# Patient Record
Sex: Male | Born: 1970 | Race: White | Hispanic: No | Marital: Married | Smoking: Never smoker
Health system: Southern US, Community
[De-identification: ages and names within clinical notes are randomized; demographics above are authoritative.]

## PROBLEM LIST (undated history)

## (undated) DIAGNOSIS — J329 Chronic sinusitis, unspecified: Secondary | ICD-10-CM

## (undated) DIAGNOSIS — R1310 Dysphagia, unspecified: Secondary | ICD-10-CM

## (undated) DIAGNOSIS — G5760 Lesion of plantar nerve, unspecified lower limb: Secondary | ICD-10-CM

## (undated) DIAGNOSIS — J45909 Unspecified asthma, uncomplicated: Secondary | ICD-10-CM

## (undated) HISTORY — PX: ESOPHAGOGASTRODUODENOSCOPY (EGD) WITH ESOPHAGEAL DILATION: SHX5812

## (undated) HISTORY — DX: Unspecified asthma, uncomplicated: J45.909

## (undated) HISTORY — DX: Chronic sinusitis, unspecified: J32.9

## (undated) HISTORY — DX: Lesion of plantar nerve, unspecified lower limb: G57.60

## (undated) HISTORY — DX: Dysphagia, unspecified: R13.10

---

## 2000-06-16 ENCOUNTER — Emergency Department (HOSPITAL_COMMUNITY): Admission: EM | Admit: 2000-06-16 | Discharge: 2000-06-17 | Payer: Self-pay | Admitting: Emergency Medicine

## 2004-05-31 ENCOUNTER — Ambulatory Visit: Payer: Self-pay | Admitting: Family Medicine

## 2004-10-19 ENCOUNTER — Ambulatory Visit: Payer: Self-pay | Admitting: Internal Medicine

## 2005-04-22 ENCOUNTER — Ambulatory Visit: Payer: Self-pay | Admitting: Internal Medicine

## 2005-05-07 ENCOUNTER — Ambulatory Visit: Payer: Self-pay | Admitting: Internal Medicine

## 2005-12-11 ENCOUNTER — Ambulatory Visit: Payer: Self-pay | Admitting: Family Medicine

## 2006-02-15 ENCOUNTER — Ambulatory Visit: Payer: Self-pay | Admitting: Internal Medicine

## 2006-04-03 ENCOUNTER — Ambulatory Visit: Payer: Self-pay | Admitting: Family Medicine

## 2006-04-03 LAB — CONVERTED CEMR LAB
ALT: 55 units/L — ABNORMAL HIGH (ref 0–40)
AST: 49 units/L — ABNORMAL HIGH (ref 0–37)
Albumin: 4 g/dL (ref 3.5–5.2)
Alkaline Phosphatase: 76 units/L (ref 39–117)
BUN: 8 mg/dL (ref 6–23)
Basophils Absolute: 0.1 10*3/uL (ref 0.0–0.1)
Basophils Relative: 2.4 % — ABNORMAL HIGH (ref 0.0–1.0)
Calcium: 8.7 mg/dL (ref 8.4–10.5)
Glomerular Filtration Rate, Af Am: 125 mL/min/{1.73_m2}
HCT: 44.1 % (ref 39.0–52.0)
Hemoglobin: 15.3 g/dL (ref 13.0–17.0)
Lymphocytes Relative: 30.4 % (ref 12.0–46.0)
Neutrophils Relative %: 53.5 % (ref 43.0–77.0)
Potassium: 4.1 meq/L (ref 3.5–5.1)
RDW: 12.6 % (ref 11.5–14.6)
TSH: 2.12 microintl units/mL (ref 0.35–5.50)
Total Bilirubin: 0.7 mg/dL (ref 0.3–1.2)
Total Protein: 6.8 g/dL (ref 6.0–8.3)
WBC: 4 10*3/uL — ABNORMAL LOW (ref 4.5–10.5)

## 2006-04-08 ENCOUNTER — Ambulatory Visit: Payer: Self-pay | Admitting: Family Medicine

## 2006-04-08 LAB — CONVERTED CEMR LAB
ALT: 78 units/L — ABNORMAL HIGH (ref 0–40)
Albumin: 3.9 g/dL (ref 3.5–5.2)
Alkaline Phosphatase: 79 units/L (ref 39–117)
Total Bilirubin: 1 mg/dL (ref 0.3–1.2)
Total Protein: 6.9 g/dL (ref 6.0–8.3)

## 2006-04-21 ENCOUNTER — Ambulatory Visit: Payer: Self-pay | Admitting: Internal Medicine

## 2006-04-24 ENCOUNTER — Ambulatory Visit: Payer: Self-pay | Admitting: Family Medicine

## 2006-04-24 LAB — CONVERTED CEMR LAB
AST: 38 units/L — ABNORMAL HIGH (ref 0–37)
Albumin: 4.2 g/dL (ref 3.5–5.2)
Alkaline Phosphatase: 63 units/L (ref 39–117)
Total Bilirubin: 0.8 mg/dL (ref 0.3–1.2)
Total Protein: 6.7 g/dL (ref 6.0–8.3)

## 2006-04-30 ENCOUNTER — Ambulatory Visit: Payer: Self-pay | Admitting: Cardiology

## 2006-05-12 ENCOUNTER — Ambulatory Visit: Payer: Self-pay | Admitting: Family Medicine

## 2006-06-05 ENCOUNTER — Ambulatory Visit: Payer: Self-pay | Admitting: Internal Medicine

## 2006-07-02 ENCOUNTER — Ambulatory Visit: Payer: Self-pay | Admitting: Family Medicine

## 2006-07-02 LAB — CONVERTED CEMR LAB
Bilirubin, Direct: 0.1 mg/dL (ref 0.0–0.3)
CO2: 28 meq/L (ref 19–32)
Calcium: 9.1 mg/dL (ref 8.4–10.5)
GFR calc Af Amer: 143 mL/min
GFR calc non Af Amer: 118 mL/min
Glucose, Bld: 100 mg/dL — ABNORMAL HIGH (ref 70–99)
Potassium: 3.8 meq/L (ref 3.5–5.1)
Total Protein: 6.8 g/dL (ref 6.0–8.3)

## 2007-01-12 ENCOUNTER — Ambulatory Visit: Payer: Self-pay | Admitting: Internal Medicine

## 2007-05-26 ENCOUNTER — Ambulatory Visit: Payer: Self-pay | Admitting: Gastroenterology

## 2007-06-08 ENCOUNTER — Encounter: Payer: Self-pay | Admitting: Gastroenterology

## 2007-06-08 ENCOUNTER — Ambulatory Visit: Payer: Self-pay | Admitting: Gastroenterology

## 2007-06-08 ENCOUNTER — Encounter: Payer: Self-pay | Admitting: Internal Medicine

## 2008-02-20 ENCOUNTER — Ambulatory Visit: Payer: Self-pay | Admitting: *Deleted

## 2008-02-20 DIAGNOSIS — J40 Bronchitis, not specified as acute or chronic: Secondary | ICD-10-CM | POA: Insufficient documentation

## 2008-02-20 DIAGNOSIS — J019 Acute sinusitis, unspecified: Secondary | ICD-10-CM | POA: Insufficient documentation

## 2008-03-01 ENCOUNTER — Telehealth (INDEPENDENT_AMBULATORY_CARE_PROVIDER_SITE_OTHER): Payer: Self-pay | Admitting: *Deleted

## 2008-11-07 ENCOUNTER — Ambulatory Visit: Payer: Self-pay | Admitting: Family Medicine

## 2008-11-07 DIAGNOSIS — L255 Unspecified contact dermatitis due to plants, except food: Secondary | ICD-10-CM | POA: Insufficient documentation

## 2010-08-31 ENCOUNTER — Inpatient Hospital Stay (INDEPENDENT_AMBULATORY_CARE_PROVIDER_SITE_OTHER)
Admission: RE | Admit: 2010-08-31 | Discharge: 2010-08-31 | Disposition: A | Discharge status: Home / Self Care | Payer: 59 | Source: Ambulatory Visit | Attending: Emergency Medicine | Admitting: Emergency Medicine

## 2010-08-31 DIAGNOSIS — J019 Acute sinusitis, unspecified: Secondary | ICD-10-CM

## 2010-09-21 ENCOUNTER — Encounter: Payer: Self-pay | Admitting: Internal Medicine

## 2010-09-21 ENCOUNTER — Ambulatory Visit (INDEPENDENT_AMBULATORY_CARE_PROVIDER_SITE_OTHER): Payer: 59 | Admitting: Internal Medicine

## 2010-09-21 DIAGNOSIS — J40 Bronchitis, not specified as acute or chronic: Secondary | ICD-10-CM

## 2010-09-21 MED ORDER — ALBUTEROL SULFATE HFA 108 (90 BASE) MCG/ACT IN AERS: 2.0000 | INHALATION_SPRAY | Freq: Four times a day (QID) | RESPIRATORY_TRACT | Status: DC | PRN

## 2010-09-21 MED ORDER — AZITHROMYCIN 250 MG PO TABS: ORAL_TABLET | ORAL | Status: AC

## 2010-09-21 MED ORDER — PREDNISONE 10 MG PO TABS: ORAL_TABLET | ORAL | Status: AC

## 2010-09-21 NOTE — Progress Notes (Signed)
  Subjective:    Patient ID: Jose [name], male    DOB: 01-31-1971, 40 y.o.   MRN: [id number]  HPI  Symptoms started about 6 weeks ago, he had severe sinus pain and congestion. 2 weeks ago went to a local urgent care, was prescribed 10 days of amoxicillin according to the patient. It made the sinuses less congested but he still has nasal discharge, chest congestion and green sputum.  Past Medical History  Diagnosis Date  . Recurrent sinusitis   . Chronic recurrent bronchiolitis   . RAD (reactive airway disease)     ??   No past surgical history on file.    Review of Systems No fevers No nausea or vomiting No itchy eyes  or itchy nose.    Objective:   Physical Exam  Constitutional: He appears well-developed and well-nourished.  HENT:  Head: Normocephalic and atraumatic.  Right Ear: External ear normal.  Left Ear: External ear normal.  Mouth/Throat: No oropharyngeal exudate.       Sinuses not tender to palpation  Eyes: Right eye exhibits no discharge. Left eye exhibits no discharge.  Neck: Normal range of motion. Neck supple.  Cardiovascular: Normal rate, regular rhythm and normal heart sounds.   No murmur heard. Pulmonary/Chest:       Lungs with few rhonchi that clear with cough. Slightly increased expiratory time, no crackles or increased work of breathing          Assessment & Plan:

## 2010-09-21 NOTE — Assessment & Plan Note (Signed)
Symptoms going on for 6 weeks, not completely well after amoxicillin. He could have an  atypical infection. Plan: Steroids, Ventolin, a Z-Pak. See instructions

## 2010-09-21 NOTE — Patient Instructions (Signed)
meds as prescribed Call if no better soon or if still have problems in 2 weeks, you will need XRs etc.

## 2010-10-09 NOTE — Assessment & Plan Note (Signed)
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   Oniel has had minimal reflux symptoms over the years, but has used Tums  intermittently.  His main complaint is one of intermittent solid food  dysphagia, mostly in his upper retropharyngeal area.  He has actually  had meat get stuck where he had to have emesis to get rid of his  impaction.  He otherwise is in good health, but denies any chronic  medical problems.  He did apparently have a viral syndrome several years  ago and had abnormal liver function tests which apparently resolved  spontaneously.  This actually was in December of 2007 and I do have an  ultrasound and CT report at that time which were unremarkable except for  small liver cysts.  There also was increased liver echodensity noted on  the ultrasound, suggesting fatty infiltration of the liver.  This was  interpreted by Dr. Lina Sar.  Actually CT scan of the abdomen was  fairly normal.   [name] otherwise in good health and denies chronic medical problems.  He  is on no medications.  Denies drug allergies.   FAMILY HISTORY:  Noncontributory.   SOCIAL HISTORY:  He is married.  He lives with his wife and child.  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  He does not smoke or use ethanol.   REVIEW OF SYSTEMS:  Entirely noncontributory.   PHYSICAL EXAMINATION:  He is a healthy-appearing white male in no  distress, appearing his stated age.  He is 5 feet 10 inches and weighs  203 pounds.  Blood pressure is 102/68, and pulse was 80 and regular.  I  could not appreciate stigmata of chronic liver disease, and there was no  thyromegaly or  lymphadenopathy.  CHEST:  Clear and he was in a regular rhythm without murmurs, gallops,  or rubs.  I could not appreciate hepatosplenomegaly, abdominal masses,  or tenderness.  Bowel sounds were normal.  Both extremities were  unremarkable.  Mental status was clear.   ASSESSMENT:  1. Probable Schatzki's ring in the distal esophagus versus      gastroesophageal reflux disease and secondary peptic stricture      versus eosinophilic esophagitis.  The patient does give an atopic      history and is on Zyrtec.  2. Possible fatty infiltration of the liver with a history of previous      abnormal liver function tests.  He says these have not been      repeated in the last year.   RECOMMENDATIONS:  1. Outpatient endoscopy and esophageal dilatation if indicated.  The      risks and benefits of this procedure have been explained to the      patient in detail and he  agrees to proceed as planned.  2. The patient has had esophageal biopsies for eosinophils      esophagitis.  3. Consider repeat laboratory data and liver function testing if this      has not been done.  I will send this letter to Dr. Drue Novel for review,      since I do not have an old chart available at this time.     Vania Rea. Jarold Motto, MD, Caleen Essex, FAGA  Electronically Signed    DRP/MedQ  DD: 05/26/2007  DT: 05/26/2007  Job #: [id number]   cc:   Willow Ora, MD

## 2012-06-23 ENCOUNTER — Encounter: Payer: Self-pay | Admitting: Internal Medicine

## 2012-06-23 ENCOUNTER — Ambulatory Visit (INDEPENDENT_AMBULATORY_CARE_PROVIDER_SITE_OTHER): Payer: 59 | Admitting: Internal Medicine

## 2012-06-23 VITALS — BP 132/84 | HR 84 | Temp 98.2°F | Wt 211.0 lb

## 2012-06-23 DIAGNOSIS — R42 Dizziness and giddiness: Secondary | ICD-10-CM

## 2012-06-23 LAB — CBC WITH DIFFERENTIAL/PLATELET
Basophils Absolute: 0 10*3/uL (ref 0.0–0.1)
Basophils Relative: 0.6 % (ref 0.0–3.0)
Hemoglobin: 14.9 g/dL (ref 13.0–17.0)
Lymphocytes Relative: 22.6 % (ref 12.0–46.0)
Monocytes Relative: 9.3 % (ref 3.0–12.0)
Neutro Abs: 4.6 10*3/uL (ref 1.4–7.7)
RBC: 5.19 Mil/uL (ref 4.22–5.81)

## 2012-06-23 LAB — BASIC METABOLIC PANEL
Calcium: 9.3 mg/dL (ref 8.4–10.5)
GFR: 133.12 mL/min (ref >60.00)
Sodium: 137 mEq/L (ref 135–145)

## 2012-06-23 NOTE — Patient Instructions (Addendum)
Call if not improving in a week, call anytime if the symptoms are severe  Dizziness Dizziness is a common problem. It is a feeling of unsteadiness or lightheadedness. You may feel like you are about to faint. Dizziness can lead to injury if you stumble or fall. A person of any age group can suffer from dizziness, but dizziness is more common in older adults. CAUSES  Dizziness can be caused by many different things, including:  Middle ear problems.  Standing for too long.  Infections.  An allergic reaction.  Aging.  An emotional response to something, such as the sight of blood.  Side effects of medicines.  Fatigue.  Problems with circulation or blood pressure.  Excess use of alcohol, medicines, or illegal drug use.  Breathing too fast (hyperventilation).  An arrhythmia or problems with your heart rhythm.  Low red blood cell count (anemia).  Pregnancy.  Vomiting, diarrhea, fever, or other illnesses that cause dehydration.  Diseases or conditions such as Parkinson's disease, high blood pressure (hypertension), diabetes, and thyroid problems.  Exposure to extreme heat. DIAGNOSIS  To find the cause of your dizziness, your caregiver may do a physical exam, lab tests, radiologic imaging scans, or an electrocardiography test (ECG).  TREATMENT  Treatment of dizziness depends on the cause of your symptoms and can vary greatly. HOME CARE INSTRUCTIONS   Drink enough fluids to keep your urine clear or pale yellow. This is especially important in very hot weather. In the elderly, it is also important in cold weather.  If your dizziness is caused by medicines, take them exactly as directed. When taking blood pressure medicines, it is especially important to get up slowly.  Rise slowly from chairs and steady yourself until you feel okay.  In the morning, first sit up on the side of the bed. When this seems okay, stand slowly while holding onto something until you know your  balance is fine.  If you need to stand in one place for a long time, be sure to move your legs often. Tighten and relax the muscles in your legs while standing.  If dizziness continues to be a problem, have someone stay with you for a day or two. Do this until you feel you are well enough to stay alone. Have the person call your caregiver if he or she notices changes in you that are concerning.  Do not drive or use heavy machinery if you feel dizzy.  Do not drink alcohol. SEEK IMMEDIATE MEDICAL CARE IF:   Your dizziness or lightheadedness gets worse.  You feel nauseous or vomit.  You develop problems with talking, walking, weakness, or using your arms, hands, or legs.  You are not thinking clearly or you have difficulty forming sentences. It may take a friend or family member to determine if your thinking is normal.  You develop chest pain, abdominal pain, shortness of breath, or sweating.  Your vision changes.  You notice any bleeding.  You have side effects from medicine that seems to be getting worse rather than better. MAKE SURE YOU:   Understand these instructions.  Will watch your condition.  Will get help right away if you are not doing well or get worse. Document Released: 11/06/2000 Document Revised: 08/05/2011 Document Reviewed: 11/30/2010 [facility] Patient Information 2013 [place], [place].

## 2012-06-23 NOTE — Assessment & Plan Note (Signed)
Likely benign peripheral dizziness, mild labyrinthitis?. Plan:  Rest, drink plenty of fluids. Will screen for anemia and electrolyte imbalances with labs. See instructions.

## 2012-06-23 NOTE — Progress Notes (Signed)
  Subjective:    Patient ID: Jose [name], male    DOB: 08-26-1970, 42 y.o.   MRN: [id number]  HPI Acute visit 2 days history of dizziness described as lightheadedness whenever he stands up quickly or moves his head right-to-left. Symptoms last few seconds,  better once he rests/stops moving his head.  Past Medical History  Diagnosis Date  . Recurrent sinusitis   . Chronic recurrent bronchiolitis   . RAD (reactive airway disease)     ??   Past Surgical History  Procedure Date  . No past surgeries     Review of Systems No recent URI symptoms. Recent headaches or head injury. No nausea, vomiting, diarrhea. No visual disturbances. not taking any medications.    Objective:   Physical Exam  General -- alert, well-developed, and well-nourished.   Neck -- normal carotid pulse HEENT-- not pale  Lungs -- normal respiratory effort, no intercostal retractions, no accessory muscle use, and normal breath sounds.   Heart-- normal rate, regular rhythm, no murmur, and no gallop.     Extremities-- no pretibial edema bilaterally Neurologic-- alert & oriented X3; speech fluent, gait normal, motor symmetric, DTRs symmetric. EOMI. Romberg absent. Psych-- Cognition and judgment appear intact. Alert and cooperative with normal attention span and concentration.  not anxious appearing and not depressed appearing.       Assessment & Plan:

## 2012-06-24 ENCOUNTER — Encounter: Payer: Self-pay | Admitting: Internal Medicine

## 2012-06-24 ENCOUNTER — Encounter: Payer: Self-pay | Admitting: *Deleted

## 2012-10-12 ENCOUNTER — Ambulatory Visit (INDEPENDENT_AMBULATORY_CARE_PROVIDER_SITE_OTHER): Payer: 59 | Admitting: Internal Medicine

## 2012-10-12 ENCOUNTER — Encounter: Payer: Self-pay | Admitting: Internal Medicine

## 2012-10-12 VITALS — BP 118/60 | HR 65 | Temp 98.4°F | Ht 71.0 in | Wt 207.0 lb

## 2012-10-12 DIAGNOSIS — Z23 Encounter for immunization: Secondary | ICD-10-CM

## 2012-10-12 DIAGNOSIS — Z0000 Encounter for general adult medical examination without abnormal findings: Secondary | ICD-10-CM

## 2012-10-12 DIAGNOSIS — R1310 Dysphagia, unspecified: Secondary | ICD-10-CM

## 2012-10-12 DIAGNOSIS — J45909 Unspecified asthma, uncomplicated: Secondary | ICD-10-CM | POA: Insufficient documentation

## 2012-10-12 NOTE — Patient Instructions (Addendum)
Come back fasting: CMP, TSH, FLP--- dx V70 Next visit one year and as needed Be sure you have a flu shot this season.

## 2012-10-12 NOTE — Progress Notes (Signed)
  Subjective:    Patient ID: Jose [name], male    DOB: [birth year], 42 y.o.   MRN: [id number]  HPI CPX Would like to see a dermatologist. See assessment and plan  Past Medical History  Diagnosis Date  . Recurrent sinusitis   . Dysphagia     EGD : occult stricture , Bx slt increaed eosinophiles  . RAD (reactive airway disease)     ??   Past Surgical History  Procedure Laterality Date  . No past surgeries     History   Social History  . Marital Status: Married    Spouse Name: N/A    Number of Children: 2  . Years of Education: N/A   Occupational History  . police officer [employer]   Social History Main Topics  . Smoking status: Never Smoker   . Smokeless tobacco: Never Used  . Alcohol Use: No  . Drug Use: No  . Sexually Active: Not on file   Other Topics Concern  . Not on file   Social History Narrative  . No narrative on file   Family History  Problem Relation Age of Onset  . Colon cancer Neg Hx   . Prostate cancer Neg Hx   . CAD Neg Hx   . Diabetes Neg Hx     Review of Systems Diet is healthy most of the time, he is active at work and also exercises. No chest pain or shortness of breath No  nausea, vomiting, diarrhea blood in the stools. No dysuria, gross hematuria. No anxiety or depression. Occasional dysphagia, symptoms are not severe. See assessment and plan. Not taking any medication.    Objective:   Physical Exam General -- alert, well-developed,NAD Neck --no thyromegaly , normal carotid pulse Lungs -- normal respiratory effort, no intercostal retractions, no accessory muscle use, and normal breath sounds.   Heart-- normal rate, regular rhythm, no murmur, and no gallop.   Abdomen--soft, non-tender, no distention, no masses, no HSM, no guarding, and no rigidity.   Extremities-- no pretibial edema bilaterally Skin: Fair skinned, few lesions consistent with SK Neurologic-- alert & oriented X3 and strength normal in all  extremities. Psych-- Cognition and judgment appear intact. Alert and cooperative with normal attention span and concentration.  not anxious appearing and not depressed appearing.       Assessment & Plan:

## 2012-10-12 NOTE — Assessment & Plan Note (Addendum)
Very se dom has dysphagia, had a EGD 05-2007: occult stricture? , Bx slt increased eosinophiles Plan: Observation

## 2012-10-12 NOTE — Assessment & Plan Note (Signed)
Tdap today Diet exercise discussed Labs: CMP, TSH, FLP Would like to see a dermatologist, has a # of skin lesions (likely SK)---> Sunscreen discussed, refer to dermatology. Office visit in one year.

## 2012-10-13 ENCOUNTER — Telehealth: Payer: Self-pay | Admitting: *Deleted

## 2012-10-13 ENCOUNTER — Other Ambulatory Visit (INDEPENDENT_AMBULATORY_CARE_PROVIDER_SITE_OTHER): Payer: 59

## 2012-10-13 DIAGNOSIS — Z0000 Encounter for general adult medical examination without abnormal findings: Secondary | ICD-10-CM

## 2012-10-13 LAB — COMPREHENSIVE METABOLIC PANEL
ALT: 25 U/L (ref 0–53)
AST: 23 U/L (ref 0–37)
Albumin: 4 g/dL (ref 3.5–5.2)
Alkaline Phosphatase: 73 U/L (ref 39–117)
Potassium: 4.1 mEq/L (ref 3.5–5.1)
Sodium: 136 mEq/L (ref 135–145)
Total Bilirubin: 0.6 mg/dL (ref 0.3–1.2)
Total Protein: 7 g/dL (ref 6.0–8.3)

## 2012-10-13 LAB — LIPID PANEL
HDL: 32 mg/dL — ABNORMAL LOW (ref >39.00)
LDL Cholesterol: 87 mg/dL (ref 0–99)
Total CHOL/HDL Ratio: 5
VLDL: 37 mg/dL (ref 0.0–40.0)

## 2012-10-13 NOTE — Telephone Encounter (Signed)
Message copied by Florene Glen on Tue Oct 13, 2012  8:28 AM ------      Message from: Jarold Motto, DAVID R      Created: Tue Oct 13, 2012  8:19 AM      Regarding: RE: question       We will make him an off appointment      ----- Message -----         From: Wanda Plump, MD         Sent: 10/12/2012   6:14 PM           To: Mardella Layman, MD      Subject: question                                                 Onalee Hua, you did a EGD on him in 2009, Bx showed some eosinophiles, still has mild dysphagia from time to time. Do you need to see him again?      THx , Jose        ------

## 2012-10-13 NOTE — Telephone Encounter (Signed)
Spoke with pt and offered him an earlier appt with a midlevel, but he will wait and see Dr Jarold Motto on 10/29/12. Mailed NP letter, appt and directions to pt.

## 2012-10-15 ENCOUNTER — Encounter: Payer: Self-pay | Admitting: *Deleted

## 2012-10-29 ENCOUNTER — Ambulatory Visit (INDEPENDENT_AMBULATORY_CARE_PROVIDER_SITE_OTHER): Payer: 59 | Admitting: Gastroenterology

## 2012-10-29 ENCOUNTER — Encounter: Payer: Self-pay | Admitting: Gastroenterology

## 2012-10-29 VITALS — BP 110/80 | HR 84 | Ht 71.0 in | Wt 207.0 lb

## 2012-10-29 DIAGNOSIS — K219 Gastro-esophageal reflux disease without esophagitis: Secondary | ICD-10-CM

## 2012-10-29 DIAGNOSIS — R1310 Dysphagia, unspecified: Secondary | ICD-10-CM

## 2012-10-29 MED ORDER — DEXLANSOPRAZOLE 60 MG PO CPDR: 60.0000 mg | DELAYED_RELEASE_CAPSULE | Freq: Every day | ORAL | Status: DC

## 2012-10-29 NOTE — Progress Notes (Signed)
History of Present Illness:  This is a 42 year old Caucasian male who underwent endoscopy in January 2009 because of dysphagia.  Endoscopic exam was reviewed and was fairly unremarkable with empiric dilation performed.  Esophageal biopsy showed inflammatory cells but not enough eosinophils to call his process eosinophilic esophagitis.  He does have some mild atopy, but denies food allergies.  He now has recurrent solid food dysphagia over the last 2 years with minimal reflux symptoms.  He denies Raynaud's phenomena or other symptoms of collagen vascular disease, any weight loss, lower GI or hepatobiliary complaints.  He has not had to visit emergency room or have her make maneuvers performed.  His dysphagia seems to be more in his upper retrosternal area.  I have reviewed this patient's present history, medical and surgical past history, allergies and medications.     ROS:   All systems were reviewed and are negative unless otherwise stated in the HPI.    Physical Exam: Blood pressure 110/80, pulse 84 and regular, and weight 207 pounds the BMI of 28.8.  Oral pharyngeal exam and neck exam is unremarkable. General well developed well nourished patient in no acute distress, appearing their stated age Eyes PERRLA, no icterus, fundoscopic exam per opthamologist Skin no lesions noted Neck supple, no adenopathy, no thyroid enlargement, no tenderness Chest clear to percussion and auscultation Heart no significant murmurs, gallops or rubs noted Abdomen no hepatosplenomegaly masses or tenderness, BS normal.  Extremities no acute joint lesions, edema, phlebitis or evidence of cellulitis. Neurologic patient oriented x 3, cranial nerves intact, no focal neurologic deficits noted. Psychological mental status normal and normal affect.  Assessment and plan: Recurrent dysphagia consistent with esophageal obstruction either from a Schatzki's rang, chronic GERD with peptic stricture, or eosinophilic esophagitis.   We will repeat his endoscopy with esophageal biopsies and appeared dilations.  I placed him on daily Nexium 40 mg 30 minutes before the first meal of the day.  He probably will need a high-resolution esophageal manometry exam also.  No diagnosis found.

## 2012-10-29 NOTE — Patient Instructions (Addendum)
You have been scheduled for an endoscopy with propofol. Please follow written instructions given to you at your visit today. If you use inhalers (even only as needed), please bring them with you on the day of your procedure. Your physician has requested that you go to www.startemmi.com and enter the access code given to you at your visit today. This web site gives a general overview about your procedure. However, you should still follow specific instructions given to you by our office regarding your preparation for the procedure.   We have given you samples of the following medication to take: Dexilant 60 mg, please take one capsule by mouth thirty minutes before breakfast once daily. If this works well for you please call us back for a prescription.   ___________________________________________________________________________________________  Diet for Gastroesophageal Reflux Disease, Adult Reflux (acid reflux) is when acid from your stomach flows up into the esophagus. When acid comes in contact with the esophagus, the acid causes irritation and soreness (inflammation) in the esophagus. When reflux happens often or so severely that it causes damage to the esophagus, it is called gastroesophageal reflux disease (GERD). Nutrition therapy can help ease the discomfort of GERD. FOODS OR DRINKS TO AVOID OR LIMIT  Smoking or chewing tobacco. Nicotine is one of the most potent stimulants to acid production in the gastrointestinal tract.  Caffeinated and decaffeinated coffee and black tea.  Regular or low-calorie carbonated beverages or energy drinks (caffeine-free carbonated beverages are allowed).   Strong spices, such as black pepper, white pepper, red pepper, cayenne, curry powder, and chili powder.  Peppermint or spearmint.  Chocolate.  High-fat foods, including meats and fried foods. Extra added fats including oils, butter, salad dressings, and nuts. Limit these to less than 8 tsp per  day.  Fruits and vegetables if they are not tolerated, such as citrus fruits or tomatoes.  Alcohol.  Any food that seems to aggravate your condition. If you have questions regarding your diet, call your caregiver or a registered dietitian. OTHER THINGS THAT MAY HELP GERD INCLUDE:   Eating your meals slowly, in a relaxed setting.  Eating 5 to 6 small meals per day instead of 3 large meals.  Eliminating food for a period of time if it causes distress.  Not lying down until 3 hours after eating a meal.  Keeping the head of your bed raised 6 to 9 inches (15 to 23 cm) by using a foam wedge or blocks under the legs of the bed. Lying flat may make symptoms worse.  Being physically active. Weight loss may be helpful in reducing reflux in overweight or obese adults.  Wear loose fitting clothing EXAMPLE MEAL PLAN This meal plan is approximately 2,000 calories based on https://www.[facility].org/ meal planning guidelines. Breakfast   cup cooked oatmeal.  1 cup strawberries.  1 cup low-fat milk.  1 oz almonds. Snack  1 cup cucumber slices.  6 oz yogurt (made from low-fat or fat-free milk). Lunch  2 slice whole-wheat bread.  2 oz sliced [place].  2 tsp mayonnaise.  1 cup blueberries.  1 cup snap peas. Snack  6 whole-wheat crackers.  1 oz string cheese. Dinner   cup brown rice.  1 cup mixed veggies.  1 tsp olive oil.  3 oz grilled fish. Document Released: 05/13/2005 Document Revised: 08/05/2011 Document Reviewed: 03/29/2011 [facility] Patient Information 2014 [place], [place]. ________________________________________________________________________________________________________________  We are excited to introduce MyChart, a new best-in-class service that provides you online access to important information in your electronic medical record. We want to make it easier for you to view your health information - all in one secure  location - when and where you need it. We expect MyChart will enhance the quality of care and service we provide.  When you register for MyChart, you can:    View your test results.    Request appointments and receive appointment reminders via email.    Request medication renewals.    View your medical history, allergies, medications and immunizations.    Communicate with your physician's office through a password-protected site.    Conveniently print information such as your medication lists.  To find out if MyChart is right for you, please talk to a member of our clinical staff today. We will gladly answer your questions about this free health and wellness tool.  If you are age 40 or older and want a member of your family to have access to your record, you must provide written consent by completing a proxy form available at our office. Please speak to our clinical staff about guidelines regarding accounts for patients younger than age 40.  As you activate your MyChart account and need any technical assistance, please call the MyChart technical support line at [phone]) or email your question to [email or web address]. If you email your question(s), please include your name, a return phone number and the best time to reach you.  If you have non-urgent health-related questions, you can send a message to our office through MyChart at [email or web address]. If you have a medical emergency, call 911.  Thank you for using MyChart as your new health and wellness resource!   MyChart licensed from [employer],  4540-9811. Patents Pending.

## 2012-11-09 ENCOUNTER — Ambulatory Visit (AMBULATORY_SURGERY_CENTER): Payer: 59 | Admitting: Gastroenterology

## 2012-11-09 ENCOUNTER — Encounter: Payer: Self-pay | Admitting: Gastroenterology

## 2012-11-09 VITALS — BP 122/69 | HR 77 | Temp 96.9°F | Resp 21 | Ht 71.0 in | Wt 207.0 lb

## 2012-11-09 DIAGNOSIS — K209 Esophagitis, unspecified without bleeding: Secondary | ICD-10-CM

## 2012-11-09 DIAGNOSIS — K219 Gastro-esophageal reflux disease without esophagitis: Secondary | ICD-10-CM

## 2012-11-09 DIAGNOSIS — R1310 Dysphagia, unspecified: Secondary | ICD-10-CM

## 2012-11-09 MED ORDER — SODIUM CHLORIDE 0.9 % IV SOLN: 500.0000 mL | INTRAVENOUS | Status: DC

## 2012-11-09 NOTE — Progress Notes (Signed)
Called to room to assist during endoscopic procedure.  Patient ID and intended procedure confirmed with present staff. Received instructions for my participation in the procedure from the performing physician.  

## 2012-11-09 NOTE — Patient Instructions (Addendum)
YOU HAD AN ENDOSCOPIC PROCEDURE TODAY AT THE [facility]: Refer to the procedure report that was given to you for any specific questions about what was found during the examination.  If the procedure report does not answer your questions, please call your gastroenterologist to clarify.  If you requested that your care partner not be given the details of your procedure findings, then the procedure report has been included in a sealed envelope for you to review at your convenience later.  YOU SHOULD EXPECT: Some feelings of bloating in the abdomen. Passage of more gas than usual.  Walking can help get rid of the air that was put into your GI tract during the procedure and reduce the bloating. If you had a lower endoscopy (such as a colonoscopy or flexible sigmoidoscopy) you may notice spotting of blood in your stool or on the toilet paper. If you underwent a bowel prep for your procedure, then you may not have a normal bowel movement for a few days.  DIET:  YOU MAY HAVE CLEAR LIQUIDS TO DRINK AT 5PM.   IF THAT GOES WELL, YOU MAY HAVE A SOFT DIET FOR THE REST OF THE DAY.  tOMORROW YOU MAY RESUME A REGULAR DIET.  ACTIVITY: Your care partner should take you home directly after the procedure.  You should plan to take it easy, moving slowly for the rest of the day.  You can resume normal activity the day after the procedure however you should NOT DRIVE or use heavy machinery for 24 hours (because of the sedation medicines used during the test).    SYMPTOMS TO REPORT IMMEDIATELY: A gastroenterologist can be reached at any hour.  During normal business hours, 8:30 AM to 5:00 PM Monday through Friday, call [phone].  After hours and on weekends, please call the GI answering service at [phone] who will take a message and have the physician on call contact you.   Following upper endoscopy (EGD)  Vomiting of blood or coffee ground material  New chest pain or pain under the shoulder  blades  Painful or persistently difficult swallowing  New shortness of breath  Fever of 100F or higher  Black, tarry-looking stools  FOLLOW UP: If any biopsies were taken you will be contacted by phone or by letter within the next 1-3 weeks.  Call your gastroenterologist if you have not heard about the biopsies in 3 weeks.  Our staff will call the home number listed on your records the next business day following your procedure to check on you and address any questions or concerns that you may have at that time regarding the information given to you following your procedure. This is a courtesy call and so if there is no answer at the home number and we have not heard from you through the emergency physician on call, we will assume that you have returned to your regular daily activities without incident.  SIGNATURES/CONFIDENTIALITY: You and/or your care partner have signed paperwork which will be entered into your electronic medical record.  These signatures attest to the fact that that the information above on your After Visit Summary has been reviewed and is understood.  Full responsibility of the confidentiality of this discharge information lies with you and/or your care-partner.   YOU MAY NEED A MANOMETRY TEST IF THIS DOESN'T WORK.  DR. Jarold Motto TOOK BIOPSIES OF YOUR ESOPHAGUS TO DETERMINE IF YOU HAVE ESOPHAGITIS.

## 2012-11-09 NOTE — Op Note (Signed)
 [facility] [address] [place], [address]   ENDOSCOPY PROCEDURE REPORT  PATIENT: Jose, [name]  MR#: [id number] BIRTHDATE: Jul 31, 1970 , 41  yrs. old GENDER: Male ENDOSCOPIST:Zymarion Favorite Hale Bogus, MD, [facility] REFERRED BY: PROCEDURE DATE:  11/09/2012 PROCEDURE:   EGD w/ biopsy and Maloney dilation of esophagus ASA CLASS:    Class II INDICATIONS: Dysphagia. MEDICATION: propofol (Diprivan) 250mg  IV TOPICAL ANESTHETIC:   Cetacaine Spray  DESCRIPTION OF PROCEDURE:   After the risks and benefits of the procedure were explained, informed consent was obtained.  The LB [id number]  endoscope was introduced through the mouth  and advanced to the second portion of the duodenum .  The instrument was slowly withdrawn as the mucosa was fully examined.    The upper, middle and distal third of the esophagus were carefully inspected and no abnormalities were noted.  The z-line was well seen at the GEJ.  The endoscope was pushed into the fundus which was normal including a retroflexed view.  The antrum, gastric body, first and second part of the duodenum were unremarkable. biopsies were obtained of the esophagus for pathologic review.  Patient was dilated.  with with a #58 [place] dilator.  There is no resistance to slight heme on the dilator.  There is no evidence of retained food products in the upper gut   Retroflexed views revealed no abnormalities.    The scope was then withdrawn from the patient and the procedure completed.  COMPLICATIONS: There were no complications.   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  RECOMMENDATIONS: we will see how this patient does symptomatically.  He continues with dysphagia we will do high-resolution esophageal manometry.  In the interim we'll treat him for acid reflux problems.  She had eosinophilic esophagitis of course of management would  be different.    _______________________________ eSigned:  Mardella Layman, MD, [facility] 11/09/2012 3:43 PM cc  DR.Paz  standard discharge   PATIENT NAME:  Jose, [name] MR#: [id number]

## 2012-11-09 NOTE — Progress Notes (Addendum)
Patient did not have preoperative order for IV antibiotic SSI prophylaxis. (G8918)  Patient did not experience any of the following events: a burn prior to discharge; a fall within the facility; wrong site/side/patient/procedure/implant event; or a hospital transfer or hospital admission upon discharge from the facility. (G8907)  

## 2012-11-10 ENCOUNTER — Telehealth: Payer: Self-pay | Admitting: Gastroenterology

## 2012-11-10 ENCOUNTER — Telehealth: Payer: Self-pay | Admitting: *Deleted

## 2012-11-10 MED ORDER — DEXLANSOPRAZOLE 60 MG PO CPDR: 60.0000 mg | DELAYED_RELEASE_CAPSULE | Freq: Every day | ORAL | Status: DC

## 2012-11-10 NOTE — Telephone Encounter (Signed)
Name identifier, left message, follow-up 

## 2012-11-10 NOTE — Telephone Encounter (Signed)
I called patient back because patient had a side effect to the Nexium, was given Dexilant. I advised patient that I can send Dexilant and patient said that's what he wanted not Nexium. Dexilant sent to pharmacy.

## 2012-11-16 ENCOUNTER — Encounter: Payer: Self-pay | Admitting: Gastroenterology

## 2013-04-21 ENCOUNTER — Ambulatory Visit (INDEPENDENT_AMBULATORY_CARE_PROVIDER_SITE_OTHER): Payer: 59 | Admitting: Internal Medicine

## 2013-04-21 ENCOUNTER — Encounter: Payer: Self-pay | Admitting: Internal Medicine

## 2013-04-21 VITALS — BP 113/75 | HR 95 | Temp 97.8°F | Wt 199.0 lb

## 2013-04-21 DIAGNOSIS — J069 Acute upper respiratory infection, unspecified: Secondary | ICD-10-CM

## 2013-04-21 MED ORDER — AMOXICILLIN 500 MG PO CAPS: 1000.0000 mg | ORAL_CAPSULE | Freq: Two times a day (BID) | ORAL | Status: DC

## 2013-04-21 NOTE — Patient Instructions (Signed)
Rest, fluids , tylenol For cough, take Mucinex DM twice a day as needed  For congestion use pseudoephedrine  Also OTC Nasocort: 2 nasal sprays on each side of the nose daily  until you feel better Take the antibiotic as prescribed  (Amoxicillin) if no better in 2-3  days  Call if no better in few days Call anytime if the symptoms are severe

## 2013-04-21 NOTE — Progress Notes (Signed)
Pre visit review using our clinic review tool, if applicable. No additional management support is needed unless otherwise documented below in the visit note. 

## 2013-04-21 NOTE — Progress Notes (Signed)
   Subjective:    Patient ID: Jose [name], male    DOB: 01/17/1971, 42 y.o.   MRN: [id number]  HPI Acute visit  Symptoms started 4 days ago with sore throat, cough mostly in the morning with mild greenish sputum production, throat congestion. He is taking Mucinex DM and pseudoephedrine.  Past Medical History  Diagnosis Date  . Recurrent sinusitis   . Dysphagia     EGD : occult stricture , Bx slt increaed eosinophiles  . RAD (reactive airway disease)     ??   Past Surgical History  Procedure Laterality Date  . No past surgeries       Review of Systems Denies fever or chills Denies chest pain, shortness or breath. He has a history of reactive airway disease but he is not wheezing currently. No myalgias, no nausea or vomiting. His wife and son are sick and taken antibiotics for respiratory infections.     Objective:   Physical Exam  BP 113/75  Pulse 95  Temp(Src) 97.8 F (36.6 C)  Wt 199 lb (90.266 kg)  SpO2 95% General -- alert, well-developed, NAD.  HEENT-- Not pale. TMs normal, throat symmetric, no redness or discharge. Face symmetric, sinuses not tender to palpation. Nose  congested. Lungs -- normal respiratory effort, no intercostal retractions, no accessory muscle use, and normal breath sounds.  Heart-- normal rate, regular rhythm, no murmur.  Neurologic--  alert & oriented X3. Speech normal, gait normal, strength normal in all extremities.  Psych-- Cognition and judgment appear intact. Cooperative with normal attention span and concentration. No anxious appearing , no depressed appearing.     Assessment & Plan:  URI, Symptoms consistent with URI, conservative treatment for few days but if he is not better he will start an antibiotic. I asked the patient to be sure that Mucinex D and does not contain pseudoephedrine (see instructions )

## 2013-07-29 ENCOUNTER — Ambulatory Visit (HOSPITAL_BASED_OUTPATIENT_CLINIC_OR_DEPARTMENT_OTHER)
Admission: RE | Admit: 2013-07-29 | Discharge: 2013-07-29 | Disposition: A | Discharge status: Home / Self Care | Payer: 59 | Source: Ambulatory Visit | Attending: Nurse Practitioner | Admitting: Nurse Practitioner

## 2013-07-29 ENCOUNTER — Encounter: Payer: Self-pay | Admitting: Nurse Practitioner

## 2013-07-29 ENCOUNTER — Other Ambulatory Visit: Payer: Self-pay | Admitting: Nurse Practitioner

## 2013-07-29 ENCOUNTER — Ambulatory Visit (INDEPENDENT_AMBULATORY_CARE_PROVIDER_SITE_OTHER): Payer: 59 | Admitting: Nurse Practitioner

## 2013-07-29 VITALS — BP 128/75 | HR 76 | Temp 98.2°F | Ht 71.0 in | Wt 197.4 lb

## 2013-07-29 DIAGNOSIS — M545 Low back pain, unspecified: Secondary | ICD-10-CM | POA: Insufficient documentation

## 2013-07-29 DIAGNOSIS — R209 Unspecified disturbances of skin sensation: Secondary | ICD-10-CM

## 2013-07-29 DIAGNOSIS — M5489 Other dorsalgia: Secondary | ICD-10-CM

## 2013-07-29 DIAGNOSIS — R202 Paresthesia of skin: Secondary | ICD-10-CM

## 2013-07-29 DIAGNOSIS — M549 Dorsalgia, unspecified: Secondary | ICD-10-CM

## 2013-07-29 LAB — VITAMIN B12: VITAMIN B 12: 254 pg/mL (ref 211–911)

## 2013-07-29 MED ORDER — PREDNISONE 10 MG PO TABS: ORAL_TABLET | ORAL | Status: DC
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
## 2013-07-29 NOTE — Patient Instructions (Signed)
Numbness in feet may be related to nerve inflammation in back which can be caused by inflammatory arthritis. Please start prednisone & do back stretches daily. Avoid movement that cause pain. Follow up in 3 weeks to evaluate treatment & review xray & lab results. Our office will call if there are urgent results.  Back Exercises Back exercises help treat and prevent back injuries. The goal of back exercises is to increase the strength of your abdominal and back muscles and the flexibility of your back. These exercises should be started when you no longer have back pain. Back exercises include:  Pelvic Tilt. Lie on your back with your knees bent. Tilt your pelvis until the lower part of your back is against the floor. Hold this position 5 to 10 sec and repeat 5 to 10 times.  Knee to Chest. Pull first 1 knee up against your chest and hold for 20 to 30 seconds, repeat this with the other knee, and then both knees. This may be done with the other leg straight or bent, whichever feels better.  Sit-Ups or Curl-Ups. Bend your knees 90 degrees. Start with tilting your pelvis, and do a partial, slow sit-up, lifting your trunk only 30 to 45 degrees off the floor. Take at least 2 to 3 seconds for each sit-up. Do not do sit-ups with your knees out straight. If partial sit-ups are difficult, simply do the above but with only tightening your abdominal muscles and holding it as directed.  Hip-Lift. Lie on your back with your knees flexed 90 degrees. Push down with your feet and shoulders as you raise your hips a couple inches off the floor; hold for 10 seconds, repeat 5 to 10 times.  Back arches. Lie on your stomach, propping yourself up on bent elbows. Slowly press on your hands, causing an arch in your low back. Repeat 3 to 5 times. Any initial stiffness and discomfort should lessen with repetition over time.  Shoulder-Lifts. Lie face down with arms beside your body. Keep hips and torso pressed to floor as you  slowly lift your head and shoulders off the floor. Do not overdo your exercises, especially in the beginning. Exercises may cause you some mild back discomfort which lasts for a few minutes; however, if the pain is more severe, or lasts for more than 15 minutes, do not continue exercises until you see your caregiver. Improvement with exercise therapy for back problems is slow.  See your caregivers for assistance with developing a proper back exercise program. Document Released: 06/20/2004 Document Revised: 08/05/2011 Document Reviewed: 03/14/2011 [facility] Patient Information 2014 [place].

## 2013-07-29 NOTE — Progress Notes (Signed)
Pre visit review using our clinic review tool, if applicable. No additional management support is needed unless otherwise documented below in the visit note. 

## 2013-07-30 ENCOUNTER — Other Ambulatory Visit: Payer: Self-pay | Admitting: Nurse Practitioner

## 2013-07-30 ENCOUNTER — Telehealth: Payer: Self-pay | Admitting: Nurse Practitioner

## 2013-07-30 DIAGNOSIS — E538 Deficiency of other specified B group vitamins: Secondary | ICD-10-CM

## 2013-07-30 DIAGNOSIS — Z1321 Encounter for screening for nutritional disorder: Secondary | ICD-10-CM

## 2013-07-30 LAB — CBC
HCT: 43.5 % (ref 39.0–52.0)
HEMOGLOBIN: 15.3 g/dL (ref 13.0–17.0)
MCH: 29.2 pg (ref 26.0–34.0)
MCHC: 35.2 g/dL (ref 30.0–36.0)
MCV: 83 fL (ref 78.0–100.0)
Platelets: 307 10*3/uL (ref 150–400)
RBC: 5.24 MIL/uL (ref 4.22–5.81)
RDW: 14.5 % (ref 11.5–15.5)
WBC: 5.6 10*3/uL (ref 4.0–10.5)

## 2013-07-30 NOTE — Progress Notes (Signed)
Added CBC to [facility] lab order.

## 2013-07-30 NOTE — Telephone Encounter (Signed)
B12 low. Pt may be experiencing B12 paresthesia. Recmd he start liquid B complex daily to get 2000 mcg B12 daily. Will check level again in 6 to 8 wks. Back & pelvis xrays are nml. However he may have some widening of SI joint -seen in early ankylosing spondylitis. Also, given Hx of inflammatory back pain, plantar fascitis, & fam Hx of tendonitis, I would not r/o spondyloarthropathy.  Recmd he continue prednisone, B12, daily back stretches, moist heat.  If no relief from prednisone, B12, stretches, & heat, suggest MRI to eval soft structures in lumbar spine or ref to Spine & scoliosis specialist or rheumatology. Discussed all w/pt.

## 2013-07-30 NOTE — Telephone Encounter (Signed)
Please schedule appointment.  Thanks

## 2013-07-30 NOTE — Telephone Encounter (Signed)
Informed patient of this and he scheduled appointment for 09/20/13 at the [facility] office.

## 2013-07-30 NOTE — Progress Notes (Signed)
Added CBC lab to [facility] lab order.

## 2013-08-02 LAB — HLA-B27 ANTIGEN: DNA Result:: NOT DETECTED

## 2013-09-20 ENCOUNTER — Other Ambulatory Visit: Payer: 59

## 2013-09-20 ENCOUNTER — Other Ambulatory Visit (INDEPENDENT_AMBULATORY_CARE_PROVIDER_SITE_OTHER): Payer: 59

## 2013-09-20 DIAGNOSIS — Z1321 Encounter for screening for nutritional disorder: Secondary | ICD-10-CM

## 2013-09-20 DIAGNOSIS — E538 Deficiency of other specified B group vitamins: Secondary | ICD-10-CM

## 2013-09-20 LAB — VITAMIN B12: Vitamin B-12: 958 pg/mL — ABNORMAL HIGH (ref 211–911)

## 2013-09-21 LAB — VITAMIN D 25 HYDROXY (VIT D DEFICIENCY, FRACTURES): Vit D, 25-Hydroxy: 38 ng/mL (ref 30–89)

## 2013-09-22 ENCOUNTER — Telehealth: Payer: Self-pay | Admitting: Nurse Practitioner

## 2013-09-22 NOTE — Telephone Encounter (Signed)
pls call pt: Advise B12 is now at nml range. Continue w/oral supplement. Ask if paresthesia in feet improved. Take maintenance dose Vit D3-500 iu qd.

## 2013-09-22 NOTE — Telephone Encounter (Signed)
Patient notified of results. Patient stated that his feet are not better. Patient wanted to know if you think he should see a specialist about his feet?

## 2013-09-23 ENCOUNTER — Other Ambulatory Visit: Payer: Self-pay | Admitting: Nurse Practitioner

## 2013-09-23 DIAGNOSIS — R202 Paresthesia of skin: Secondary | ICD-10-CM

## 2013-09-23 DIAGNOSIS — M419 Scoliosis, unspecified: Secondary | ICD-10-CM

## 2013-09-23 DIAGNOSIS — M5489 Other dorsalgia: Secondary | ICD-10-CM

## 2013-09-23 NOTE — Telephone Encounter (Signed)
pls call pt: Ask if prednisone taper helped tingling in feet. If so, I will refer to [facility] of [facility]

## 2013-09-23 NOTE — Progress Notes (Unsigned)
Pt.notified

## 2013-09-23 NOTE — Telephone Encounter (Signed)
Patient said that he could not tell any difference after taking the prednisone. Sometimes the symptoms ease off and then there will be a flare up.

## 2013-10-13 ENCOUNTER — Other Ambulatory Visit: Payer: Self-pay | Admitting: Orthopaedic Surgery

## 2013-10-13 DIAGNOSIS — M545 Low back pain, unspecified: Secondary | ICD-10-CM

## 2013-10-14 ENCOUNTER — Encounter: Payer: Self-pay | Admitting: Nurse Practitioner

## 2013-10-14 DIAGNOSIS — G629 Polyneuropathy, unspecified: Secondary | ICD-10-CM | POA: Insufficient documentation

## 2013-10-24 ENCOUNTER — Ambulatory Visit
Admission: RE | Admit: 2013-10-24 | Discharge: 2013-10-24 | Disposition: A | Discharge status: Home / Self Care | Payer: 59 | Source: Ambulatory Visit | Attending: Orthopaedic Surgery | Admitting: Orthopaedic Surgery

## 2013-10-24 DIAGNOSIS — M545 Low back pain, unspecified: Secondary | ICD-10-CM

## 2014-03-04 ENCOUNTER — Ambulatory Visit (INDEPENDENT_AMBULATORY_CARE_PROVIDER_SITE_OTHER): Payer: 59 | Admitting: Podiatrist

## 2014-03-04 ENCOUNTER — Encounter: Payer: Self-pay | Admitting: Podiatrist

## 2014-03-04 ENCOUNTER — Ambulatory Visit (INDEPENDENT_AMBULATORY_CARE_PROVIDER_SITE_OTHER): Payer: 59

## 2014-03-04 VITALS — BP 117/80 | HR 78 | Resp 16 | Ht 70.0 in | Wt 195.0 lb

## 2014-03-04 DIAGNOSIS — G629 Polyneuropathy, unspecified: Secondary | ICD-10-CM

## 2014-03-04 DIAGNOSIS — M779 Enthesopathy, unspecified: Secondary | ICD-10-CM

## 2014-03-04 MED ORDER — GABAPENTIN 100 MG PO CAPS: 100.0000 mg | ORAL_CAPSULE | Freq: Every day | ORAL | Status: DC

## 2014-03-04 NOTE — Patient Instructions (Signed)
Dr. Jacklynn Ganong is the doctor who is great at diagnosing and treating neuropathy.  She has a website that is great at explaining her practice if you google her name.  Dr. Raquel Sarna [place]  Peripheral Neuropathy Peripheral neuropathy is a type of nerve damage. It affects nerves that carry signals between the spinal cord and other parts of the body. These are called peripheral nerves. With peripheral neuropathy, one nerve or a group of nerves may be damaged.  CAUSES  Many things can damage peripheral nerves. For some people with peripheral neuropathy, the cause is unknown. Some causes include:  Diabetes. This is the most common cause of peripheral neuropathy.  Injury to a nerve.  Pressure or stress on a nerve that lasts a long time.  Too little vitamin B. Alcoholism can lead to this.  Infections.  Autoimmune diseases, such as multiple sclerosis and systemic lupus erythematosus.  Inherited nerve diseases.  Some medicines, such as cancer drugs.  Toxic substances, such as lead and mercury.  Too little blood flowing to the legs.  Kidney disease.  Thyroid disease. SIGNS AND SYMPTOMS  Different people have different symptoms. The symptoms you have will depend on which of your nerves is damaged. Common symptoms include:  Loss of feeling (numbness) in the feet and hands.  Tingling in the feet and hands.  Pain that burns.  Very sensitive skin.  Weakness.  Not being able to move a part of the body (paralysis).  Muscle twitching.  Clumsiness or poor coordination.  Loss of balance.  Not being able to control your bladder.  Feeling dizzy.  Sexual problems. DIAGNOSIS  Peripheral neuropathy is a symptom, not a disease. Finding the cause of peripheral neuropathy can be hard. To figure that out, your health care provider will take a medical history and do a physical exam. A neurological exam will also be done. This involves checking things affected by your brain, spinal  cord, and nerves (nervous system). For example, your health care provider will check your reflexes, how you move, and what you can feel.  Other types of tests may also be ordered, such as:  Blood tests.  A test of the fluid in your spinal cord.  Imaging tests, such as CT scans or an MRI.  Electromyography (EMG). This test checks the nerves that control muscles.  Nerve conduction velocity tests. These tests check how fast messages pass through your nerves.  Nerve biopsy. A small piece of nerve is removed. It is then checked under a microscope. TREATMENT   Medicine is often used to treat peripheral neuropathy. Medicines may include:  Pain-relieving medicines. Prescription or over-the-counter medicine may be suggested.  Antiseizure medicine. This may be used for pain.  Antidepressants. These also may help ease pain from neuropathy.  Lidocaine. This is a numbing medicine. You might wear a patch or be given a shot.  Mexiletine. This medicine is typically used to help control irregular heart rhythms.  Surgery. Surgery may be needed to relieve pressure on a nerve or to destroy a nerve that is causing pain.  Physical therapy to help movement.  Assistive devices to help movement. HOME CARE INSTRUCTIONS   Only take over-the-counter or prescription medicines as directed by your health care provider. Follow the instructions carefully for any given medicines. Do not take any other medicines without first getting approval from your health care provider.  If you have diabetes, work closely with your health care provider to keep your blood sugar under control.  If you have numbness  in your feet:  Check every day for signs of injury or infection. Watch for redness, warmth, and swelling.  Wear padded socks and comfortable shoes. These help protect your feet.  Do not do things that put pressure on your damaged nerve.  Do not smoke. Smoking keeps blood from getting to damaged  nerves.  Avoid or limit alcohol. Too much alcohol can cause a lack of B vitamins. These vitamins are needed for healthy nerves.  Develop a good support system. Coping with peripheral neuropathy can be stressful. Talk to a mental health specialist or join a support group if you are struggling.  Follow up with your health care provider as directed. SEEK MEDICAL CARE IF:   You have new signs or symptoms of peripheral neuropathy.  You are struggling emotionally from dealing with peripheral neuropathy.  You have a fever. SEEK IMMEDIATE MEDICAL CARE IF:   You have an injury or infection that is not healing.  You feel very dizzy or begin vomiting.  You have chest pain.  You have trouble breathing. Document Released: 05/03/2002 Document Revised: 01/23/2011 Document Reviewed: 01/18/2013 [facility] Patient Information 2015 [place], [place]. This information is not intended to replace advice given to you by your health care provider. Make sure you discuss any questions you have with your health care provider.

## 2014-03-04 NOTE — Progress Notes (Signed)
   Subjective:    Patient ID: Jose [name], male    DOB: 1971-01-31, 43 y.o.   MRN: [id number]  HPI Comments: "I have discomfort in the ball of my foot"  Patient c/o burning and numbness plantar forefoot bilateral for several months. He did go to the doc-has had back xray and MRI, thought nerve damage, also put on B12 tablet. He believes the B12 has helped some of the numbness. He has trouble wearing certain shoes.   Foot Pain Associated symptoms include numbness.      Review of Systems  Neurological: Positive for numbness.  All other systems reviewed and are negative.      Objective:   Physical Exam Vascular status is intact pedal pulses palpable. Neurological sensation is decreased via Semmes Weinstein monofilament at 2/5 sites bilateral. Forefoot numbness is noted bilateral. Negative Tinel sign is elicited. No sign of interdigital neuroma bilateral. Forefoot pain and discomfort noted with palpation. Mild prominence of her foot but not significant. X-rays are negative for fracture or, or abnormality. Diffuse signs of peripheral neuropathy elicited.    Assessment & Plan:  Idiopathic peripheral neuropathy  Plan: Recommended starting him on a low dose of gabapentin. Also discussed a referral to Dr. Jacklynn Ganong for comprehensive workup into why he has the neuropathy. He will call if he would need a referral. Otherwise he'll try the gabapentin and see how he does with this.

## 2014-03-28 ENCOUNTER — Telehealth: Payer: Self-pay | Admitting: *Deleted

## 2014-03-28 MED ORDER — GABAPENTIN 100 MG PO CAPS: 100.0000 mg | ORAL_CAPSULE | Freq: Two times a day (BID) | ORAL | Status: DC

## 2014-03-28 NOTE — Telephone Encounter (Signed)
I have a prescription issue I need to address.  If someone would give me a call back.  I called a couple of weeks ago.  I actually called before I was leaving town and left a message.  I guess you guys didn't call me back until the following day, I had already left.  Please give me a call back to address the issue.  I returned his call.  "I came and saw Dr. Valentina Lucks 3-4 weeks ago.  She prescribed Gabapentin 100 mg, told me to take 1 a day for a week.  If that didn't help take 2 pills a day.  The 100 mg a day helped just a little.  So I started taking 2 pills a day which has helped even more.  I need to to have the prescription changed.  I just went and got a refill.  I also need to find out how long she wants me to take this medicine."  I told him she is out of the office today.  I will try to contact her tomorrow or it will be Wednesday then we'll call you with a response.  "That's fine."

## 2014-03-28 NOTE — Telephone Encounter (Signed)
done

## 2014-03-29 NOTE — Telephone Encounter (Signed)
I called the patient and informed him that Dr. Valentina Lucks sent a prescription to [place] for the Gabapentin, take one twice daily.  "Did she say how long she wanted me to try this before I come back in to see her?"  I told him she did not say but she did mention in her note about a referral to Dr. Neomia Dear.  "She gave me some information on her.  I will see how this goes for a few weeks.  If not doing better, I will call."  Sounds good.

## 2014-04-08 ENCOUNTER — Ambulatory Visit (INDEPENDENT_AMBULATORY_CARE_PROVIDER_SITE_OTHER): Payer: 59 | Admitting: Internal Medicine

## 2014-04-08 ENCOUNTER — Encounter: Payer: Self-pay | Admitting: Internal Medicine

## 2014-04-08 VITALS — BP 132/82 | HR 91 | Temp 98.1°F | Wt 202.2 lb

## 2014-04-08 DIAGNOSIS — G629 Polyneuropathy, unspecified: Secondary | ICD-10-CM

## 2014-04-08 DIAGNOSIS — R1310 Dysphagia, unspecified: Secondary | ICD-10-CM

## 2014-04-08 LAB — COMPREHENSIVE METABOLIC PANEL
ALBUMIN: 3.7 g/dL (ref 3.5–5.2)
ALT: 22 U/L (ref 0–53)
AST: 19 U/L (ref 0–37)
Alkaline Phosphatase: 80 U/L (ref 39–117)
BUN: 12 mg/dL (ref 6–23)
CALCIUM: 9.1 mg/dL (ref 8.4–10.5)
CHLORIDE: 102 meq/L (ref 96–112)
CO2: 26 mEq/L (ref 19–32)
Creatinine, Ser: 0.8 mg/dL (ref 0.4–1.5)
GFR: 121.83 mL/min (ref >60.00)
GLUCOSE: 105 mg/dL — AB (ref 70–99)
POTASSIUM: 3.6 meq/L (ref 3.5–5.1)
SODIUM: 137 meq/L (ref 135–145)
TOTAL PROTEIN: 7.2 g/dL (ref 6.0–8.3)
Total Bilirubin: 0.6 mg/dL (ref 0.2–1.2)

## 2014-04-08 LAB — SEDIMENTATION RATE: Sed Rate: 8 mm/hr (ref 0–22)

## 2014-04-08 LAB — VITAMIN B12: Vitamin B-12: 1240 pg/mL — ABNORMAL HIGH (ref 211–911)

## 2014-04-08 LAB — FOLATE

## 2014-04-08 NOTE — Progress Notes (Signed)
Pre visit review using our clinic review tool, if applicable. No additional management support is needed unless otherwise documented below in the visit note. 

## 2014-04-08 NOTE — Assessment & Plan Note (Signed)
more than a year history of symmetric symptoms consistent with peripheral neuropathy, feet. Workup so far included blood work that show a slightly decreased B12. Had a back MRI 09-2013-- normal Failure to trial with Neurontin, could not tolerate it. Symptoms decrease with OTC B12 supplementation. Plan: Labs including a B12 Go back to a large dose of B12 supplements Return to the office 3 months. If symptoms completely well with either oral or parenteral B12 supplementation no further workup otherwise will refer to neurology. Also consider further eval for B12 deficiency

## 2014-04-08 NOTE — Progress Notes (Signed)
   Subjective:    Patient ID: Jose [name], male    DOB: 03/22/1971, 43 y.o.   MRN: [id number]  DOS:  04/08/2014 Type of visit - description : acute, not getting better Interval history: > One-year history of symmetric numbness on the feet, worse distally, feels numb/"death". Has seen 3 providers already. Initially, B12 was in the low side of normal, he started a large OTC supplement, he got better except for the tip of the toes.  Then he decrease the dose of the OTC B12 supplement and the symptoms are coming back. Symptoms are not worse at night, they are actually better when he takes his shoes off.  He took gabapentin, did not notice any improvement and actually make him feel weird.   When it was at is  worse, he had symptoms not only @ the distal foot but on the distal calf as well   ROS History of esophageal atresia, status post a stretch, now asymptomatic, denies any nausea, vomiting, diarrhea. No dysphasia or odynophagia. At some point was taking PPIs but he stopped them a while back. No other paresthesias, headaches or dizziness. Mild fatigue.   Past Medical History  Diagnosis Date  . Recurrent sinusitis   . Dysphagia     EGD : occult stricture , Bx slt increaed eosinophiles  . RAD (reactive airway disease)     ??    Past Surgical History  Procedure Laterality Date  . No past surgeries      History   Social History  . Marital Status: Married    Spouse Name: N/A    Number of Children: 2  . Years of Education: N/A   Occupational History  . police officer Unemployed   Social History Main Topics  . Smoking status: Never Smoker   . Smokeless tobacco: Never Used  . Alcohol Use: No  . Drug Use: No  . Sexual Activity: Not on file   Other Topics Concern  . 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         Medication List       This list is accurate as of: 04/08/14 11:59 PM.  Always use your most  recent med list.               B-12 PO  Take by mouth.           Objective:   Physical Exam BP 132/82 mmHg  Pulse 91  Temp(Src) 98.1 F (36.7 C) (Oral)  Wt 202 lb 4 oz (91.74 kg)  SpO2 99%  General -- alert, well-developed, NAD.   HEENT-- Not pale.   Extremities-- no pretibial edema bilaterally ; inspection on palpation of the ankles, feet normal. Good pedal pulses. Neurologic--  alert & oriented X3. Speech normal, gait appropriate for age, strength symmetric and appropriate for age.  DTRs symmetric. Pinprick examination feet normal Psych-- Cognition and judgment appear intact. Cooperative with normal attention span and concentration. No anxious or depressed appearing.      Assessment & Plan:

## 2014-04-08 NOTE — Patient Instructions (Signed)
Get your blood work before you leave   B12: 1000 to  2000 mcg daily  Please come back to the office in 3 months  for a routine check up

## 2014-04-08 NOTE — Assessment & Plan Note (Signed)
Currently asymptomatic, not taking any PPIs

## 2014-04-09 LAB — RHEUMATOID FACTOR: Rhuematoid fact SerPl-aCnc: <10 IU/mL (ref <=14)

## 2014-04-11 LAB — ANA: Anti Nuclear Antibody(ANA): NEGATIVE

## 2014-04-11 NOTE — Addendum Note (Signed)
Addended by: Wilfrid Lund on: 04/11/2014 01:22 PM   Modules accepted: Orders

## 2014-04-13 ENCOUNTER — Telehealth: Payer: Self-pay | Admitting: Internal Medicine

## 2014-04-13 NOTE — Telephone Encounter (Signed)
Pt called with numbness and pain in back and legs, Pt has Neuro appt with Dr. Posey Pronto on 05/31/2014, Dr. Larose Kells would like to see if Pt can be seen sooner.

## 2014-04-13 NOTE — Telephone Encounter (Signed)
Caller name: Hoy Relation to pt: self Call back number: [phone] Pharmacy:  Reason for call:   Yesterday numbness can back with a vengeance into his legs and up into his back. He states that he does have a little bit of fatigue. He wants to know if there are any other tests that he could have?

## 2014-04-13 NOTE — Telephone Encounter (Signed)
Spoke with Neurology, told to route phone note to MD to see if she could work patient in.

## 2014-04-13 NOTE — Telephone Encounter (Signed)
Please advise. Pt has appt with Dr. Posey Pronto in Neurology on 05/31/2014.

## 2014-04-13 NOTE — Telephone Encounter (Addendum)
Could we get a sooner neuro appointment? Please let me know, if unable to be seen soon, we may need to run some additional tests, MRI?

## 2014-04-14 NOTE — Telephone Encounter (Signed)
Pt will come in on Friday 04-15-14 to see Dr Posey Pronto. She had a open slot come open today,

## 2014-04-14 NOTE — Telephone Encounter (Signed)
We will work him in sooner.  Donika K. Posey Pronto, DO

## 2014-04-15 ENCOUNTER — Ambulatory Visit (INDEPENDENT_AMBULATORY_CARE_PROVIDER_SITE_OTHER): Payer: 59 | Admitting: Neurology

## 2014-04-15 ENCOUNTER — Encounter: Payer: Self-pay | Admitting: Neurology

## 2014-04-15 VITALS — BP 118/84 | HR 89 | Ht 70.0 in | Wt 202.4 lb

## 2014-04-15 DIAGNOSIS — G609 Hereditary and idiopathic neuropathy, unspecified: Secondary | ICD-10-CM

## 2014-04-15 NOTE — Patient Instructions (Signed)
1.  Check blood work 2.  EMG of the legs 3.  If your symptoms worsen, please let my office know so we can call in a prescription for lidocaine ointment 4.  Return to clinic in 34-months

## 2014-04-15 NOTE — Progress Notes (Signed)
[facility] Clinic Note - Initial Visit   Date: 04/15/2014  Langley Ingalls MRN: [id number] DOB: [birth year]   Dear Dr. Larose Kells:   Thank you for your kind referral of Ruger Saxer for consultation of peripheral neuropathy. Although his history is well known to you, please allow [place] to reiterate it for the purpose of our medical record. The patient was accompanied to the clinic by self.    History of Present Illness: Aristidis Talerico is a 43 y.o. right-handed Caucasian male with history of esophageal atresia s/p stretch presenting for evaluation of peripheral neuropathy.    Starting around 2014, he was taking acid reflex medication and a few months later, he developed numbness and burning pain of the lower legs.  When symptoms did not improve, he saw his PCP who check vitamin B12 which was found to be low (254).  Once this was corrected, his burning pain improved for about a week or so but has not totally resolved.    Currently, he has burning and numbness over the great toe and medial sole.  Light pressure such as bed sheets and tight constrictive shows aggravates his pain.  He has to wear loose fitting shoes or prefers to go barefoot.  He denies any weakness.  Previously pain was 8/10 and no it has reduced to 2-3/10.  When severe, he tends to smell a metalic scent.    He has been evaluated at the [facility] who recommended trial of gabapentin.  This alleviated symptoms slightly at 246m/d but he developed side effects of anxiety/depression so this was stopped.  He also saw a naturalpath who recommended that he start taking various supplements.   Out-side paper records, electronic medical record, and images have been reviewed where available and summarized as:  MRI lumbar spine 10/24/2013:    No evidence of lumbar disc herniation, spinal stenosis, foraminal narrowing or nerve root compression. Labs 04/08/2013:  Vitamin B12 1240,folate >20, ESR 8, RF <10, ANA neg Labs  10/13/2012:  TSH 0.88   Past Medical History  Diagnosis Date  . Recurrent sinusitis   . Dysphagia     EGD : occult stricture , Bx slt increaed eosinophiles  . RAD (reactive airway disease)     ??    Past Surgical History  Procedure Laterality Date  . No past surgeries       Medications:  Current Outpatient Prescriptions on File Prior to Visit  Medication Sig Dispense Refill  . Cyanocobalamin (B-12 PO) Take by mouth.     No current facility-administered medications on file prior to visit.    Allergies:  Allergies  Allergen Reactions  . Nexium [Esomeprazole Magnesium] Nausea Only    Family History: Family History  Problem Relation Age of Onset  . Colon cancer Neg Hx   . Prostate cancer Neg Hx   . CAD Neg Hx   . Diabetes Neg Hx   . Arthritis Mother   . Asthma Son     Social History: History   Social History  . Marital Status: Married    Spouse Name: N/A    Number of Children: 2  . Years of Education: N/A   Occupational History  . police officer Unemployed   Social History Main Topics  . Smoking status: Never Smoker   . Smokeless tobacco: Never Used  . Alcohol Use: No  . Drug Use: No  . Sexual Activity: Not on file   Other Topics Concern  . 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  Lives with wife and children in a 2 story home.  Education: 4 year college degree.      Review of Systems:  CONSTITUTIONAL: No fevers, chills, night sweats, or weight loss.   EYES: No visual changes or eye pain ENT: No hearing changes.  No history of nose bleeds.   RESPIRATORY: No cough, wheezing and shortness of breath.   CARDIOVASCULAR: Negative for chest pain, and palpitations.   GI: Negative for abdominal discomfort, blood in stools or black stools.  No recent change in bowel habits. GU:  No history of incontinence.   MUSCLOSKELETAL: No history of joint pain or swelling.  No myalgias.   SKIN: Negative for  lesions, rash, and itching.   HEMATOLOGY/ONCOLOGY: Negative for prolonged bleeding, bruising easily, and swollen nodes.  No history of cancer.   ENDOCRINE: Negative for cold or heat intolerance, polydipsia or goiter.   PSYCH:  No depression or anxiety symptoms.   NEURO: As Above.   Vital Signs:  BP 118/84 mmHg  Pulse 89  Ht 5' 10"  (1.778 m)  Wt 202 lb 6 oz (91.797 kg)  BMI 29.04 kg/m2  SpO2 98% Pain Scale: 2 on a scale of 0-10   General Medical Exam:   General:  Well appearing, comfortable.   Eyes/ENT: see cranial nerve examination.   Neck: No masses appreciated.  Full range of motion without tenderness.  No carotid bruits. Respiratory:  Clear to auscultation, good air entry bilaterally.   Cardiac:  Regular rate and rhythm, no murmur.   Extremities:  No deformities, edema, or skin discoloration. Good capillary refill.   Skin:  Skin color, texture, turgor normal. No rashes or lesions.  Neurological Exam: MENTAL STATUS including orientation to time, place, person, recent and remote memory, attention span and concentration, language, and fund of knowledge is normal.  Speech is not dysarthric.  CRANIAL NERVES: II:  No visual field defects.  Unremarkable fundi.   III-IV-VI: Pupils equal round and reactive to light.  Normal conjugate, extra-ocular eye movements in all directions of gaze.  No nystagmus.  No ptosis.   V:  Normal facial sensation.   VII:  Normal facial symmetry and movements.  No pathologic facial reflexes.  VIII:  Normal hearing and vestibular function.   IX-X:  Normal palatal movement.   XI:  Normal shoulder shrug and head rotation.   XII:  Normal tongue strength and range of motion, no deviation or fasciculation.  MOTOR:  No atrophy, fasciculations or abnormal movements.  No pronator drift.  Tone is normal.    Right Upper Extremity:    Left Upper Extremity:    Deltoid  5/5   Deltoid  5/5   Biceps  5/5   Biceps  5/5   Triceps  5/5   Triceps  5/5   Wrist extensors   5/5   Wrist extensors  5/5   Wrist flexors  5/5   Wrist flexors  5/5   Finger extensors  5/5   Finger extensors  5/5   Finger flexors  5/5   Finger flexors  5/5   Dorsal interossei  5/5   Dorsal interossei  5/5   Abductor pollicis  5/5   Abductor pollicis  5/5   Tone (Ashworth scale)  0  Tone (Ashworth scale)  0   Right Lower Extremity:    Left Lower Extremity:    Hip flexors  5/5   Hip flexors  5/5  Hip extensors  5/5   Hip extensors  5/5   Knee flexors  5/5   Knee flexors  5/5   Knee extensors  5/5   Knee extensors  5/5   Dorsiflexors  5/5   Dorsiflexors  5/5   Plantarflexors  5/5   Plantarflexors  5/5   Toe extensors  5/5   Toe extensors  5/5   Toe flexors  5/5   Toe flexors  5/5   Tone (Ashworth scale)  0  Tone (Ashworth scale)  0   MSRs:  Right                                                                 Left brachioradialis 2+  brachioradialis 2+  biceps 2+  biceps 2+  triceps 2+  triceps 2+  patellar 2+  patellar 2+  ankle jerk 2+  ankle jerk 2+  Hoffman no  Hoffman no  plantar response down  plantar response down   SENSORY: Pin prick with hyperesthesias over the distal toes, otherwise ormal and symmetric perception of light touch, vibration, and proprioception.  Romberg's sign absent.   COORDINATION/GAIT: Normal finger-to- nose-finger and heel-to-shin.  Intact rapid alternating movements bilaterally.  Able to rise from a chair without using arms.  Gait narrow based and stable. Tandem and stressed gait intact.    IMPRESSION: Mr. Sundt is a 43 year-old gentleman presenting with bilateral feet dysesthesias.  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  He is fortunately showing signs of improvement especially with recovery of symptoms from proximal to distal, which is expected.  For completeness, will screen for other treatable causes of neuropathy.  EDX will be ordered to determine severity  and characterize whether neuropathy is demyelinating vs axonal, especially with his relative early onset of symptoms presentation.   PLAN/RECOMMENDATIONS:  1.  Check vitamin B1, vitamin B6, copper, ceruloplasmin, SPEP/UPEP with IFE, ACE, 2-hr glucose tolerance, TSH, zinc 2.  EMG of the legs 3.  Consider lidocaine ointment going forward 4.  Return to clinic in 100-month.   The duration of this appointment visit was 45 minutes of face-to-face time with the patient.  Greater than 50% of this time was spent in counseling, explanation of diagnosis, planning of further management, and coordination of care.   Thank you for allowing me to participate in patient's care.  If I can answer any additional questions, I would be pleased to do so.    Sincerely,    Terrionna Bridwell K. PPosey Pronto DO

## 2014-04-16 LAB — ANGIOTENSIN CONVERTING ENZYME: ANGIOTENSIN-CONVERTING ENZYME: 19 U/L (ref 8–52)

## 2014-04-16 LAB — TSH: TSH: 0.91 u[IU]/mL (ref 0.350–4.500)

## 2014-04-17 LAB — COPPER, SERUM: COPPER: 81 ug/dL (ref 70–175)

## 2014-04-17 LAB — ZINC: Zinc: 63 ug/dL (ref 60–130)

## 2014-04-18 ENCOUNTER — Ambulatory Visit (INDEPENDENT_AMBULATORY_CARE_PROVIDER_SITE_OTHER): Payer: 59 | Admitting: Neurology

## 2014-04-18 DIAGNOSIS — G609 Hereditary and idiopathic neuropathy, unspecified: Secondary | ICD-10-CM

## 2014-04-18 LAB — VITAMIN B6: Vitamin B6: 104 ng/mL — ABNORMAL HIGH (ref 2.1–21.7)

## 2014-04-18 LAB — CERULOPLASMIN: CERULOPLASMIN: 25 mg/dL (ref 18–36)

## 2014-04-18 NOTE — Procedures (Signed)
[facility]  [address]  [place], [place] [address] Tel: [phone] Fax:  [phone] Test Date:  04/18/2014  Patient: Jose [name] DOB: [birth year] Physician: Narda Amber, DO  Sex: Male Height: 5\' 10"  Ref Phys: Narda Amber  ID#: [id number] Temp: 34.0C Technician: Laureen Ochs R. NCS T.   Patient Complaints: Patient is a 43 year old male with history of vitamin B12 deficiency presenting for evaluation of bilateral feet paresthesias.  NCV & EMG Findings: Extensive electrodiagnostic testing of the left lower extremity and additional studies of the right reveals:  1. Bilateral sural and superficial peroneal sensory responses are within normal limits.  2. Bilateral peroneal and tibial motor responses are within normal limits.  3. There is no evidence of active or chronic motor axon loss changes affecting any of the tested muscles. Motor unit recruitment and configuration is within normal limits.   Impression: This is a normal study. In particular, there is no evidence of a large fiber generalized sensorimotor polyneuropathy affecting the lower extremities; however, small fiber neuropathy cannot be excluded by this study.   ___________________________ Narda Amber, DO    Nerve Conduction Studies Anti Sensory Summary Table   Site NR Peak (ms) Norm Peak (ms) P-T Amp (V) Norm P-T Amp  Left Sup Peroneal Anti Sensory (Ant Lat Mall)  12 cm    2.4 <4.5 10.8 >5  Right Sup Peroneal Anti Sensory (Ant Lat Mall)  12 cm    2.8 <4.5 8.5 >5  Left Sural Anti Sensory (Lat Mall)  Calf    3.5 <4.5 9.9 >5  Right Sural Anti Sensory (Lat Mall)  Calf    3.4 <4.5 8.3 >5   Motor Summary Table   Site NR Onset (ms) Norm Onset (ms) O-P Amp (mV) Norm O-P Amp Site1 Site2 Delta-0 (ms) Dist (cm) Vel (m/s) Norm Vel (m/s)  Left Peroneal Motor (Ext Dig Brev)  Ankle    3.3 <5.5 8.0 >3 B Fib Ankle 8.0 36.0 45 >40  B Fib    11.3  7.1  Poplt B Fib 1.6 6.5 41 >40  Poplt    12.9  7.0          Right Peroneal Motor (Ext Dig Brev)  Ankle    3.4 <5.5 6.4 >3 B Fib Ankle 7.9 37.0 47 >40  B Fib    11.3  5.9  Poplt B Fib 1.5 8.0 53 >40  Poplt    12.8  5.9         Left Peroneal TA Motor (Tib Ant)  Fib Head    1.6 <4.0 7.0 >4 Poplit Fib Head 2.0 9.0 45 >40  Poplit    3.6  7.2         Right Peroneal TA Motor (Tib Ant)  Fib Head    2.4 <4.0 7.0 >4 Poplit Fib Head 1.5 8.0 53 >40  Poplit    3.9  6.8         Left Tibial Motor (Abd Hall Brev)  Ankle    4.6 <6.0 15.8 >8 Knee Ankle 8.4 43.0 51 >40  Knee    13.0  11.7         Right Tibial Motor (Abd Hall Brev)  Ankle    4.8 <6.0 14.0 >8 Knee Ankle 8.6 44.0 51 >40  Knee    13.4  11.0          EMG   Side Muscle Ins Act Fibs Psw Fasc Number Recrt Dur Dur. Amp  Amp. Poly Poly. Comment  Left AntTibialis Nml Nml Nml Nml Nml Nml Nml Nml Nml Nml Nml Nml N/A  Left Gastroc Nml Nml Nml Nml Nml Nml Nml Nml Nml Nml Nml Nml N/A  Left Flex Dig Long Nml Nml Nml Nml Nml Nml Nml Nml Nml Nml Nml Nml N/A  Left RectFemoris Nml Nml Nml Nml Nml Nml Nml Nml Nml Nml Nml Nml N/A  Left GluteusMed Nml Nml Nml Nml Nml Nml Nml Nml Nml Nml Nml Nml N/A  Left BicepsFemS Nml Nml Nml Nml Nml Nml Nml Nml Nml Nml Nml Nml N/A      Waveforms:

## 2014-04-19 LAB — SPEP & IFE WITH QIG
ALPHA-1-GLOBULIN: 3.5 % (ref 2.9–4.9)
ALPHA-2-GLOBULIN: 9.7 % (ref 7.1–11.8)
Albumin ELP: 62.3 % (ref 55.8–66.1)
BETA 2: 5.3 % (ref 3.2–6.5)
Beta Globulin: 5.5 % (ref 4.7–7.2)
GAMMA GLOBULIN: 13.7 % (ref 11.1–18.8)
IGG (IMMUNOGLOBIN G), SERUM: 999 mg/dL (ref 650–1600)
IgA: 167 mg/dL (ref 68–379)
IgM, Serum: 124 mg/dL (ref 41–251)
Total Protein, Serum Electrophoresis: 7.1 g/dL (ref 6.0–8.3)

## 2014-04-19 LAB — UIFE/LIGHT CHAINS/TP QN, 24-HR UR
ALPHA 1 UR: DETECTED — AB
ALPHA 2 UR: DETECTED — AB
Albumin, U: DETECTED
BETA UR: DETECTED — AB
GAMMA UR: DETECTED — AB
Total Protein, Urine: <4 mg/dL — ABNORMAL LOW (ref 5–25)

## 2014-04-19 LAB — VITAMIN B1: VITAMIN B1 (THIAMINE): 14 nmol/L (ref 8–30)

## 2014-05-03 ENCOUNTER — Other Ambulatory Visit: Payer: 59

## 2014-05-03 DIAGNOSIS — G609 Hereditary and idiopathic neuropathy, unspecified: Secondary | ICD-10-CM

## 2014-05-03 LAB — GLUCOSE TOLERANCE, 2 HOURS
Glucose, 1 Hour GTT: 131 mg/dL
Glucose, 2 hour: 136 mg/dL
Glucose, Fasting: 97 mg/dL (ref 70–99)

## 2014-05-31 ENCOUNTER — Ambulatory Visit: Payer: 59 | Admitting: Neurology

## 2014-07-04 ENCOUNTER — Ambulatory Visit (INDEPENDENT_AMBULATORY_CARE_PROVIDER_SITE_OTHER): Payer: 59 | Admitting: Internal Medicine

## 2014-07-04 ENCOUNTER — Encounter: Payer: Self-pay | Admitting: Internal Medicine

## 2014-07-04 VITALS — BP 121/82 | HR 83 | Temp 98.1°F | Ht 70.0 in | Wt 201.0 lb

## 2014-07-04 DIAGNOSIS — Z0000 Encounter for general adult medical examination without abnormal findings: Secondary | ICD-10-CM

## 2014-07-04 DIAGNOSIS — G629 Polyneuropathy, unspecified: Secondary | ICD-10-CM

## 2014-07-04 NOTE — Patient Instructions (Signed)
Stop by the front desk and schedule labs to be done within few days (fasting)    Please come back to the office in 1 year  for a physical exam. Come back fasting

## 2014-07-04 NOTE — Progress Notes (Signed)
Pre visit review using our clinic review tool, if applicable. No additional management support is needed unless otherwise documented below in the visit note. 

## 2014-07-04 NOTE — Progress Notes (Signed)
Subjective:    Patient ID: Jose [name], male    DOB: 1971/04/26, 44 y.o.   MRN: [id number]  DOS:  07/04/2014 Type of visit - description : cpx Interval history: Since the last time he was here, he continue with lower extremity paresthesias. Sometimes he has generalized hot feeling ( almost like a hot flash).    Review of Systems  Constitutional: Negative for diaphoresis, appetite change and unexpected weight change.  HENT: Negative for dental problem, ear discharge, facial swelling, trouble swallowing and voice change.   Eyes: Negative for photophobia, discharge and redness.  Respiratory:       No cough or sputum production. No wheezing or SOB  Cardiovascular:       No CP No edema or  palpitations   Gastrointestinal: Negative for nausea, vomiting, abdominal pain and diarrhea.       No blood in the stools   Endocrine: Negative for polydipsia, polyphagia and polyuria.  Genitourinary: Negative for urgency, frequency, hematuria and difficulty urinating.       No dysuria. Denies any decreased libido or erectile dysfunction  Musculoskeletal: Negative for joint swelling.       No unusual aches or pains   Skin: Negative for color change, pallor and rash.  Allergic/Immunologic: Negative for environmental allergies and food allergies.  Neurological: Negative for dizziness and syncope.       No headaches   Hematological: Negative for adenopathy. Does not bruise/bleed easily.  Psychiatric/Behavioral: Negative for suicidal ideas, hallucinations, behavioral problems and confusion.       No unusual or severe anxiety-depression     Past Medical History  Diagnosis Date  . Recurrent sinusitis   . Dysphagia     EGD : occult stricture , Bx slt increaed eosinophiles  . RAD (reactive airway disease)     ??    Past Surgical History  Procedure Laterality Date  . No past surgeries      History   Social History  . Marital Status: Married    Spouse Name: N/A    Number of Children: 2    . Years of Education: N/A   Occupational History  . police officer    Social History Main Topics  . Smoking status: Never Smoker   . Smokeless tobacco: Never Used  . Alcohol Use: No  . Drug Use: No  . Sexual Activity: Not on file   Other Topics Concern  . 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     Lives with wife and children in a 2 story home.           Family History  Problem Relation Age of Onset  . Colon cancer Neg Hx   . Prostate cancer Neg Hx   . CAD Neg Hx   . Diabetes Neg Hx   . Arthritis Mother     Living  . Asthma Son   . Healthy Mother     Living  . Thyroid disease Sister        Medication List       This list is accurate as of: 07/04/14  7:40 PM.  Always use your most recent med list.               B-12 PO  Take by mouth.     UNABLE TO FIND  Med Name: B-right     UNABLE TO FIND  Med Name:  Nerve Eight           Objective:   Physical Exam  Constitutional: He is oriented to person, place, and time. He appears well-developed. No distress.  HENT:  Head: Normocephalic and atraumatic.  Right Ear: External ear normal.  Left Ear: External ear normal.  Nose: Nose normal.  Throat symmetric, no red, discharge. Uvula midline  Cardiovascular:  RRR, no murmur, rub or gallop  Pulmonary/Chest: Effort normal. No respiratory distress.  CTA B  Abdominal: Soft. Bowel sounds are normal. He exhibits no distension and no mass. There is no tenderness. There is no rebound and no guarding.  Musculoskeletal: Normal range of motion. He exhibits no edema or tenderness.  Neurological: He is alert and oriented to person, place, and time. No cranial nerve deficit. He exhibits normal muscle tone. Coordination normal.  Speech normal, gait unassisted and normal for age, motor strength appropriate for age   Skin: Skin is warm and dry. No pallor.  No jaundice  Psychiatric: He has a normal mood and  affect. His behavior is normal. Judgment and thought content normal.  Vitals reviewed.  BP 121/82 mmHg  Pulse 83  Temp(Src) 98.1 F (36.7 C) (Oral)  Ht 5\' 10"  (1.778 m)  Wt 201 lb (91.173 kg)  BMI 28.84 kg/m2  SpO2 94%       Assessment & Plan:   Problem List Items Addressed This Visit      Nervous and Auditory   Peripheral neuropathy    Since the last office visit, he had a nerve conduction study which was negative. He continue with symptoms. His B12 and B6 were elevated, he was recommended to  decrease supplements. He however is taking B12 and fish oil  3 times a week. Plan: Recheck B12 levels per patient request and to be sure they're back to normal. Follow-up with neurology      Relevant Orders   Vitamin B6   Vitamin B12     Other   Annual physical exam - Primary    Tdap today Diet-- usually ok Exercise discussed Labs:   FLP, B12 B 6 levels   Would like to see a dermatologist, has a # of skin lesions (likely SK)---> Sunscreen discussed, refer to dermatology. Office visit in one year.        Relevant Orders   EKG 12-Lead (Completed)   Lipid panel

## 2014-07-04 NOTE — Assessment & Plan Note (Signed)
Since the last office visit, he had a nerve conduction study which was negative. He continue with symptoms. His B12 and B6 were elevated, he was recommended to  decrease supplements. He however is taking B12 and fish oil  3 times a week. Plan: Recheck B12 levels per patient request and to be sure they're back to normal. Follow-up with neurology

## 2014-07-04 NOTE — Assessment & Plan Note (Addendum)
Tdap today Diet-- usually ok Exercise discussed Labs:   FLP, B12 B 6 levels   Saw dermatologist last year as recommended  Office visit in one year.

## 2014-07-06 ENCOUNTER — Other Ambulatory Visit (INDEPENDENT_AMBULATORY_CARE_PROVIDER_SITE_OTHER): Payer: 59

## 2014-07-06 DIAGNOSIS — G629 Polyneuropathy, unspecified: Secondary | ICD-10-CM

## 2014-07-06 DIAGNOSIS — Z0000 Encounter for general adult medical examination without abnormal findings: Secondary | ICD-10-CM

## 2014-07-06 LAB — LIPID PANEL
CHOL/HDL RATIO: 4
CHOLESTEROL: 157 mg/dL (ref 0–200)
HDL: 38.2 mg/dL — AB (ref >39.00)
LDL CALC: 90 mg/dL (ref 0–99)
NonHDL: 118.8
TRIGLYCERIDES: 143 mg/dL (ref 0.0–149.0)
VLDL: 28.6 mg/dL (ref 0.0–40.0)

## 2014-07-06 LAB — VITAMIN B12: Vitamin B-12: 523 pg/mL (ref 211–911)

## 2014-07-12 LAB — VITAMIN B6: Vitamin B6: 14.8 ng/mL (ref 2.1–21.7)

## 2014-07-19 ENCOUNTER — Encounter: Payer: Self-pay | Admitting: Neurology

## 2014-07-19 ENCOUNTER — Ambulatory Visit (INDEPENDENT_AMBULATORY_CARE_PROVIDER_SITE_OTHER): Payer: 59 | Admitting: Neurology

## 2014-07-19 VITALS — BP 110/80 | HR 91 | Ht 70.0 in | Wt 202.4 lb

## 2014-07-19 DIAGNOSIS — G609 Hereditary and idiopathic neuropathy, unspecified: Secondary | ICD-10-CM

## 2014-07-19 DIAGNOSIS — R202 Paresthesia of skin: Secondary | ICD-10-CM

## 2014-07-19 NOTE — Progress Notes (Signed)
Follow-up Visit   Date: 07/19/2014   Jose [name] MRN: [id number] DOB: 10/22/1970   Interim History: Jose [name] is a 44 y.o. right-handed Caucasian male with returning to the clinic for follow-up of paresthesias.  The patient was accompanied to the clinic by self.    History of present illness: Starting around 2014, he was taking acid reflex medication and a few months later, he developed numbness and burning pain of the lower legs. When symptoms did not improve, he saw his PCP who check vitamin B12 which was found to be low (254). Once this was corrected, his burning pain improved for about a week or so but has not totally resolved.   Currently, he has burning and numbness over the great toe and medial sole. Light pressure such as bed sheets and tight constrictive shows aggravates his pain. He has to wear loose fitting shoes or prefers to go barefoot. He denies any weakness. Previously pain was 8/10 and no it has reduced to 2-3/10. When severe, he tends to smell a metalic scent.   He has been evaluated at the [facility] who recommended trial of gabapentin. This alleviated symptoms slightly at 219m/d but he developed side effects of anxiety/depression so this was stopped. He also saw a naturalpath who recommended that he start taking various supplements.   UPDATE 07/19/2014: EMG did not show evidence of neuropathy affecting the legs. He reports having 3-day episode of generalized tingling involving his legs, arms, and face which spontaneously resolved.  Since cutting out soda and stopping vitamin B6, his painful paresthesias have improved.  He has now noticed numbness/tingling between the 2nd and 3rd toes, especially when driving and weight bearing and is concerned about Morton's neuroma.  No weakness. He did have swelling of the right toe a few weeks ago.   Walking barefoot makes it worse.   Medications:  No current outpatient prescriptions on file prior to visit.   No  current facility-administered medications on file prior to visit.    Allergies:  Allergies  Allergen Reactions  . Nexium [Esomeprazole Magnesium] Nausea Only    Review of Systems:  CONSTITUTIONAL: No fevers, chills, night sweats, or weight loss.  EYES: No visual changes or eye pain ENT: No hearing changes.  No history of nose bleeds.   RESPIRATORY: No cough, wheezing and shortness of breath.   CARDIOVASCULAR: Negative for chest pain, and palpitations.   GI: Negative for abdominal discomfort, blood in stools or black stools.  No recent change in bowel habits.   GU:  No history of incontinence.   MUSCLOSKELETAL: No history of joint pain or swelling.  No myalgias.   SKIN: Negative for lesions, rash, and itching.   ENDOCRINE: Negative for cold or heat intolerance, polydipsia or goiter.   PSYCH:  No depression or anxiety symptoms.   NEURO: As Above.   Vital Signs:  BP 110/80 mmHg  Pulse 91  Ht _0  (1.778 m)  Wt 202 lb 7 oz (91.825 kg)  BMI 29.05 kg/m2  SpO2 98%  Neurological Exam: MENTAL STATUS including orientation to time, place, person, recent and remote memory, attention span and concentration, language, and fund of knowledge is normal.  Speech is not dysarthric.  CRANIAL NERVES: Pupils equal round and reactive to light.  Normal conjugate, extra-ocular eye movements in all directions of gaze.  Face is symmetric. Palate elevates symmetrically.  Tongue is midline.  MOTOR:  Motor strength is 5/5 in all extremities. Tone is normal.    MSRs:  Reflexes are 2+/4 throughout.  SENSORY:  Intact to vibration throughout.  COORDINATION/GAIT:   Gait narrow based and stable.   Data: EMG of the legs 04/18/2014: This is a normal study. In particular, there is no evidence of a large fiber generalized sensorimotor polyneuropathy affecting the lower extremities; however, small fiber neuropathy cannot be excluded by this study.  MRI lumbar spine 10/24/2013: No evidence of lumbar disc  herniation, spinal stenosis, foraminal narrowing or nerve root compression. Labs 04/08/2013: Vitamin B12 1240,folate >20, ESR 8, RF <10, ANA neg Labs 10/13/2012: TSH 0.88  Labs 04/15/2014:  vitamin B1 14, vitamin B6 104**, copper 81, ceruloplasmin 25, SPEP/UPEP with IFE no M protein, ACE 19, 2-hr glucose tolerance normal, TSH 0.91, zinc 63  Component     Latest Ref Rng 07/06/2014  Vitamin B6     2.1 - 21.7 ng/mL 14.8  Vitamin B-12     211 - 911 pg/mL 523    IMPRESSION/PLAN: 1.  Bilateral feet paresthesias, improving.  Possibly related to vitamin B6 toxicity, since stopping this supplement has helped and levels are now within normal range.  EMG of the legs was normal without evidence of neuropathy.   Continue vitamin B12 1039mg daily  2.  Episode of transient whole body paresthesias, unclear etiology.   If symptoms recur, MRI brain wwo contrast, however with normal exam, likely likely to have anything worrisome  3.  Morton's neuroma (?) Referral to podiatry for evaluation and mangement  Return to clinic as needed   The duration of this appointment visit was 30 minutes of face-to-face time with the patient.  Greater than 50% of this time was spent in counseling, explanation of diagnosis, planning of further management, and coordination of care.   Thank you for allowing me to participate in patient's care.  If I can answer any additional questions, I would be pleased to do so.    Sincerely,    Donika K. PPosey Pronto DO

## 2014-07-19 NOTE — Patient Instructions (Addendum)
1.  Agree with evaluation by a podiatrist for possible Morton's neuroma 2.  If your whole-body symptoms return, please call my office to schedule MRI

## 2014-11-17 ENCOUNTER — Encounter: Payer: Self-pay | Admitting: Podiatry

## 2014-11-17 ENCOUNTER — Ambulatory Visit (INDEPENDENT_AMBULATORY_CARE_PROVIDER_SITE_OTHER): Payer: 59 | Admitting: Podiatry

## 2014-11-17 ENCOUNTER — Ambulatory Visit: Payer: 59

## 2014-11-17 VITALS — BP 137/96 | HR 92 | Resp 12

## 2014-11-17 DIAGNOSIS — G629 Polyneuropathy, unspecified: Secondary | ICD-10-CM | POA: Diagnosis not present

## 2014-11-17 DIAGNOSIS — M79673 Pain in unspecified foot: Secondary | ICD-10-CM

## 2014-11-17 DIAGNOSIS — D3610 Benign neoplasm of peripheral nerves and autonomic nervous system, unspecified: Secondary | ICD-10-CM

## 2014-11-18 NOTE — Progress Notes (Signed)
Subjective:     Patient ID: Jose [name], male   DOB: May 26, 1971, 44 y.o.   MRN: [id number]  HPI patient states he is still having pain in his forefoot bilateral with special pain between the second and third toes right over left. States tighter shoes bother him more than anything and that his vitamin B's have helped some of his problems but the pain that he still experiences in his feet is still present to quite a significant degree   Review of Systems     Objective:   Physical Exam Neurovascular status intact muscle strength was found to be adequate with patient having most of the pain between the second and third toes right over left with mild diffuse pain noted    Assessment:     Neuroma symptomatology right over left possible versus peripheral neuropathy or combination of the 2    Plan:     H&P and conditions reviewed with patient. Today were to focus on this as a possible nerve entrapment and I did do for the right foot and neuro lysis injection of 1. cc 5 alcohol Marcaine combination and we'll reevaluate again in 2 weeks. May consider nerve biopsies

## 2014-12-05 ENCOUNTER — Ambulatory Visit (INDEPENDENT_AMBULATORY_CARE_PROVIDER_SITE_OTHER): Payer: Commercial Managed Care - HMO | Admitting: Podiatry

## 2014-12-05 ENCOUNTER — Encounter: Payer: Self-pay | Admitting: Podiatry

## 2014-12-05 VITALS — BP 133/81 | HR 74 | Resp 12

## 2014-12-05 DIAGNOSIS — G629 Polyneuropathy, unspecified: Secondary | ICD-10-CM

## 2014-12-05 DIAGNOSIS — D3610 Benign neoplasm of peripheral nerves and autonomic nervous system, unspecified: Secondary | ICD-10-CM

## 2014-12-06 NOTE — Progress Notes (Signed)
Subjective:     Patient ID: Herson Prichard, male   DOB: 11-15-1970, 44 y.o.   MRN: [id number]  HPI patient states right foot is better than left foot and it seems like the injection made a difference. I do think I developed a reaction right had some flushing the just lasted for a little while and then went away but I did not have any other problems with it. Patient states he feels fine now and was noted to have a palpable click in the second interspace of both feet with distal irritation   Review of Systems     Objective:   Physical Exam On above H&P form    Assessment:     Possibility for neuroma-like symptoms bilateral with other possibilities for systemic neuropathy or other nerve condition but I am just at least trying to improve this particular problem. Sounds like reaction was benign on description and the fact he's better    Plan:     I reviewed condition and discussed risk of injection with him and he wants to go ahead with injections. I did do sterile prep of each foot and I injected the second interspace with a purified alcohol solution of 1.5 mL that was mixed with Marcaine. Patient tolerated procedure well and will reappoint again in about 4 weeks

## 2015-01-02 ENCOUNTER — Ambulatory Visit: Payer: Commercial Managed Care - HMO | Admitting: Podiatry

## 2015-01-16 ENCOUNTER — Ambulatory Visit: Payer: Commercial Managed Care - HMO | Admitting: Podiatry

## 2015-01-20 ENCOUNTER — Ambulatory Visit: Payer: Commercial Managed Care - HMO | Admitting: Podiatry

## 2015-02-01 ENCOUNTER — Ambulatory Visit (INDEPENDENT_AMBULATORY_CARE_PROVIDER_SITE_OTHER): Payer: Commercial Managed Care - HMO | Admitting: Podiatry

## 2015-02-01 ENCOUNTER — Encounter: Payer: Self-pay | Admitting: Podiatry

## 2015-02-01 VITALS — BP 117/80 | HR 84 | Resp 16

## 2015-02-01 DIAGNOSIS — G629 Polyneuropathy, unspecified: Secondary | ICD-10-CM

## 2015-02-01 DIAGNOSIS — G5761 Lesion of plantar nerve, right lower limb: Secondary | ICD-10-CM

## 2015-02-01 DIAGNOSIS — G5762 Lesion of plantar nerve, left lower limb: Secondary | ICD-10-CM | POA: Diagnosis not present

## 2015-02-01 DIAGNOSIS — D3610 Benign neoplasm of peripheral nerves and autonomic nervous system, unspecified: Secondary | ICD-10-CM

## 2015-02-01 NOTE — Progress Notes (Signed)
Subjective:     Patient ID: Jose [name], male   DOB: [birth year], 44 y.o.   MRN: [id number]  HPI patient points to between the second and third toe of the right foot stating that it's been somewhat improved and seems to be less pain than what he had previously. States she's able to wear more shoes than he was but he stillpresent   Review of Systems     Objective:   Physical Exam Neurovascular status intact with continued discomfort and shooting pains between the right and left second interspace with moderate improvement occurring from previous visit    Assessment:     Appears to be a compression of the second interspace bilateral with possibilities of neuropathy still present     Plan:     Careful injection of the second interspace bilateral with a dehydrated purified alcohol solution 1.3 mL bilateral for a neuro lysis type injection. Reappoint in 4 weeks

## 2015-03-02 ENCOUNTER — Encounter: Payer: Self-pay | Admitting: Podiatry

## 2015-03-02 ENCOUNTER — Ambulatory Visit (INDEPENDENT_AMBULATORY_CARE_PROVIDER_SITE_OTHER): Payer: Commercial Managed Care - HMO | Admitting: Podiatry

## 2015-03-02 VITALS — BP 115/79 | HR 90 | Resp 16

## 2015-03-02 DIAGNOSIS — G5762 Lesion of plantar nerve, left lower limb: Secondary | ICD-10-CM

## 2015-03-02 DIAGNOSIS — D3610 Benign neoplasm of peripheral nerves and autonomic nervous system, unspecified: Secondary | ICD-10-CM

## 2015-03-02 DIAGNOSIS — G629 Polyneuropathy, unspecified: Secondary | ICD-10-CM

## 2015-03-02 NOTE — Progress Notes (Signed)
Subjective:     Patient ID: Jose [name], male   DOB: 17-Dec-1970, 44 y.o.   MRN: [id number]  HPI patient states the pain has improved but is still present and seems to be worse with shoe gear   Review of Systems     Objective:   Physical Exam Neurovascular status intact muscle strength adequate no change in health history with continued discomfort second intermetatarsal space bilateral    Assessment:     Neuroma symptomatology still present second interspace bilateral with some improvement occurring    Plan:     Continue to trend and a good direction and today we will continue conservative treatment. I did sterile prep of each foot and injected directly into the second interspace with 1.5 mL of purified alcohol Marcaine solution which was tolerated well and reappoint in 5 weeks

## 2015-03-21 IMAGING — CR DG LUMBAR SPINE 2-3V
3 series · 3 of 3 positions shown · non-contrast
Comparison: None.

CLINICAL DATA: numb calves AND feet, Hx: morning stiffness, lumbar
pain relieved w/movement

EXAM:
LUMBAR SPINE - 2-3 VIEW

[t l-spine a.p.]
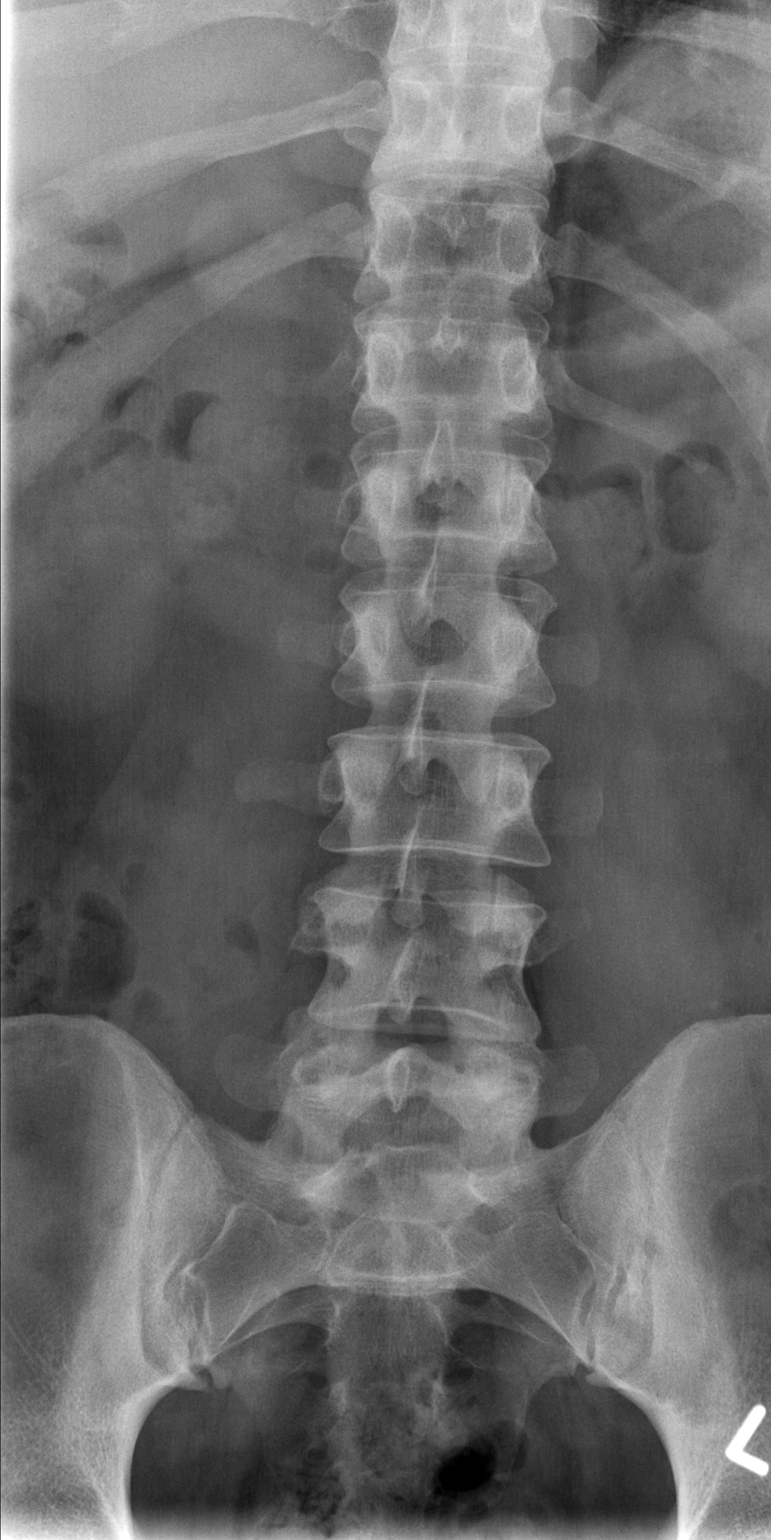

[t l-spine lat]
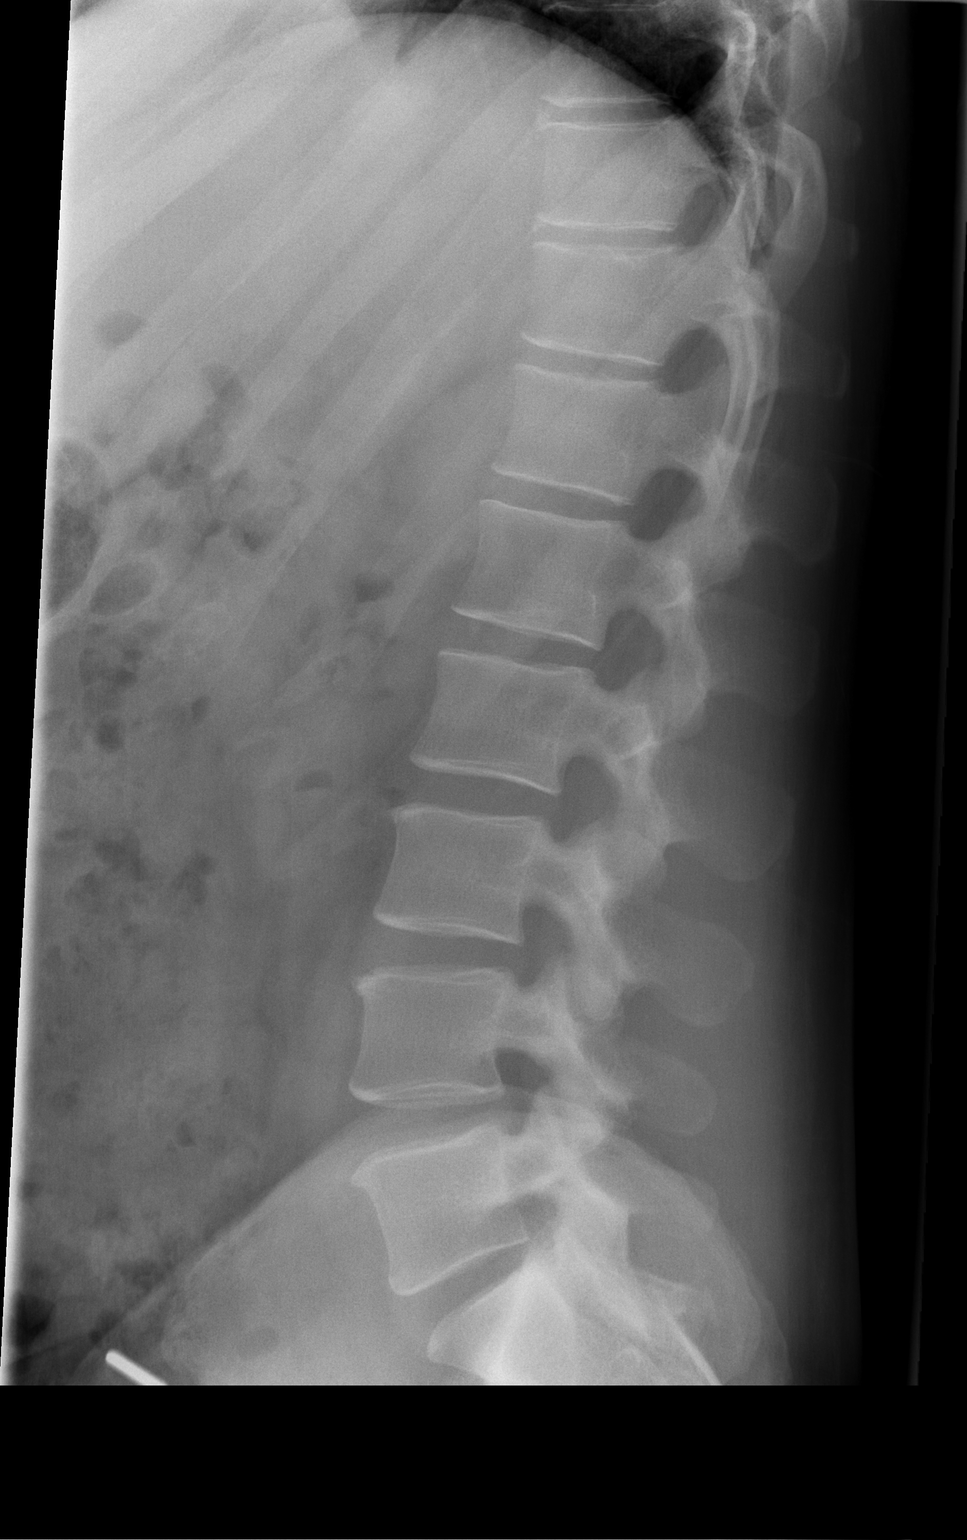

[t l-spine l5-s1 spot]
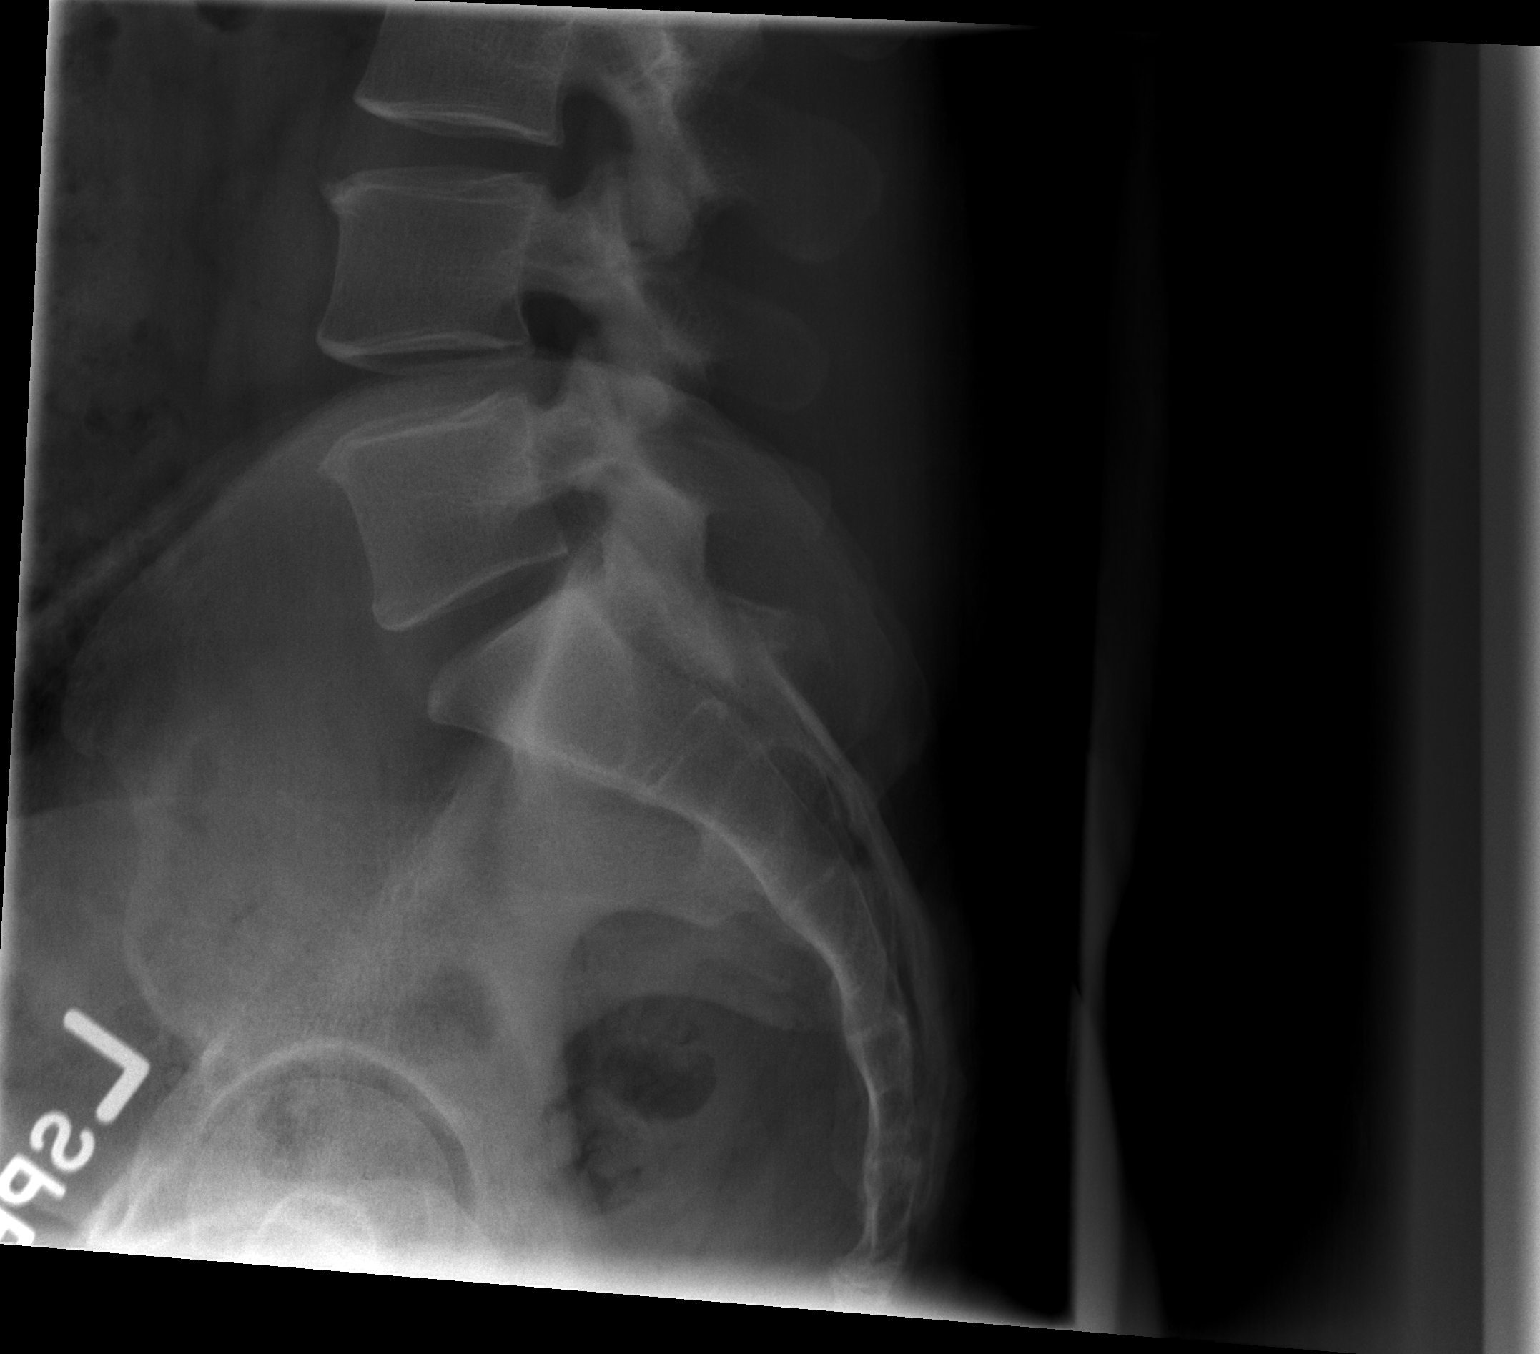

[3 of 3 positions shown; findings below may reference images not displayed]

FINDINGS: There is no evidence of lumbar spine fracture. Alignment is normal.
Intervertebral disc spaces are maintained.
IMPRESSION: Negative.

## 2015-03-21 IMAGING — CR DG PELVIS 1-2V
1 series · 1 of 1 positions shown · non-contrast
Comparison: None.

CLINICAL DATA: Low back pain

EXAM:
PELVIS - 1-2 VIEW

[t pelvis a.p.]
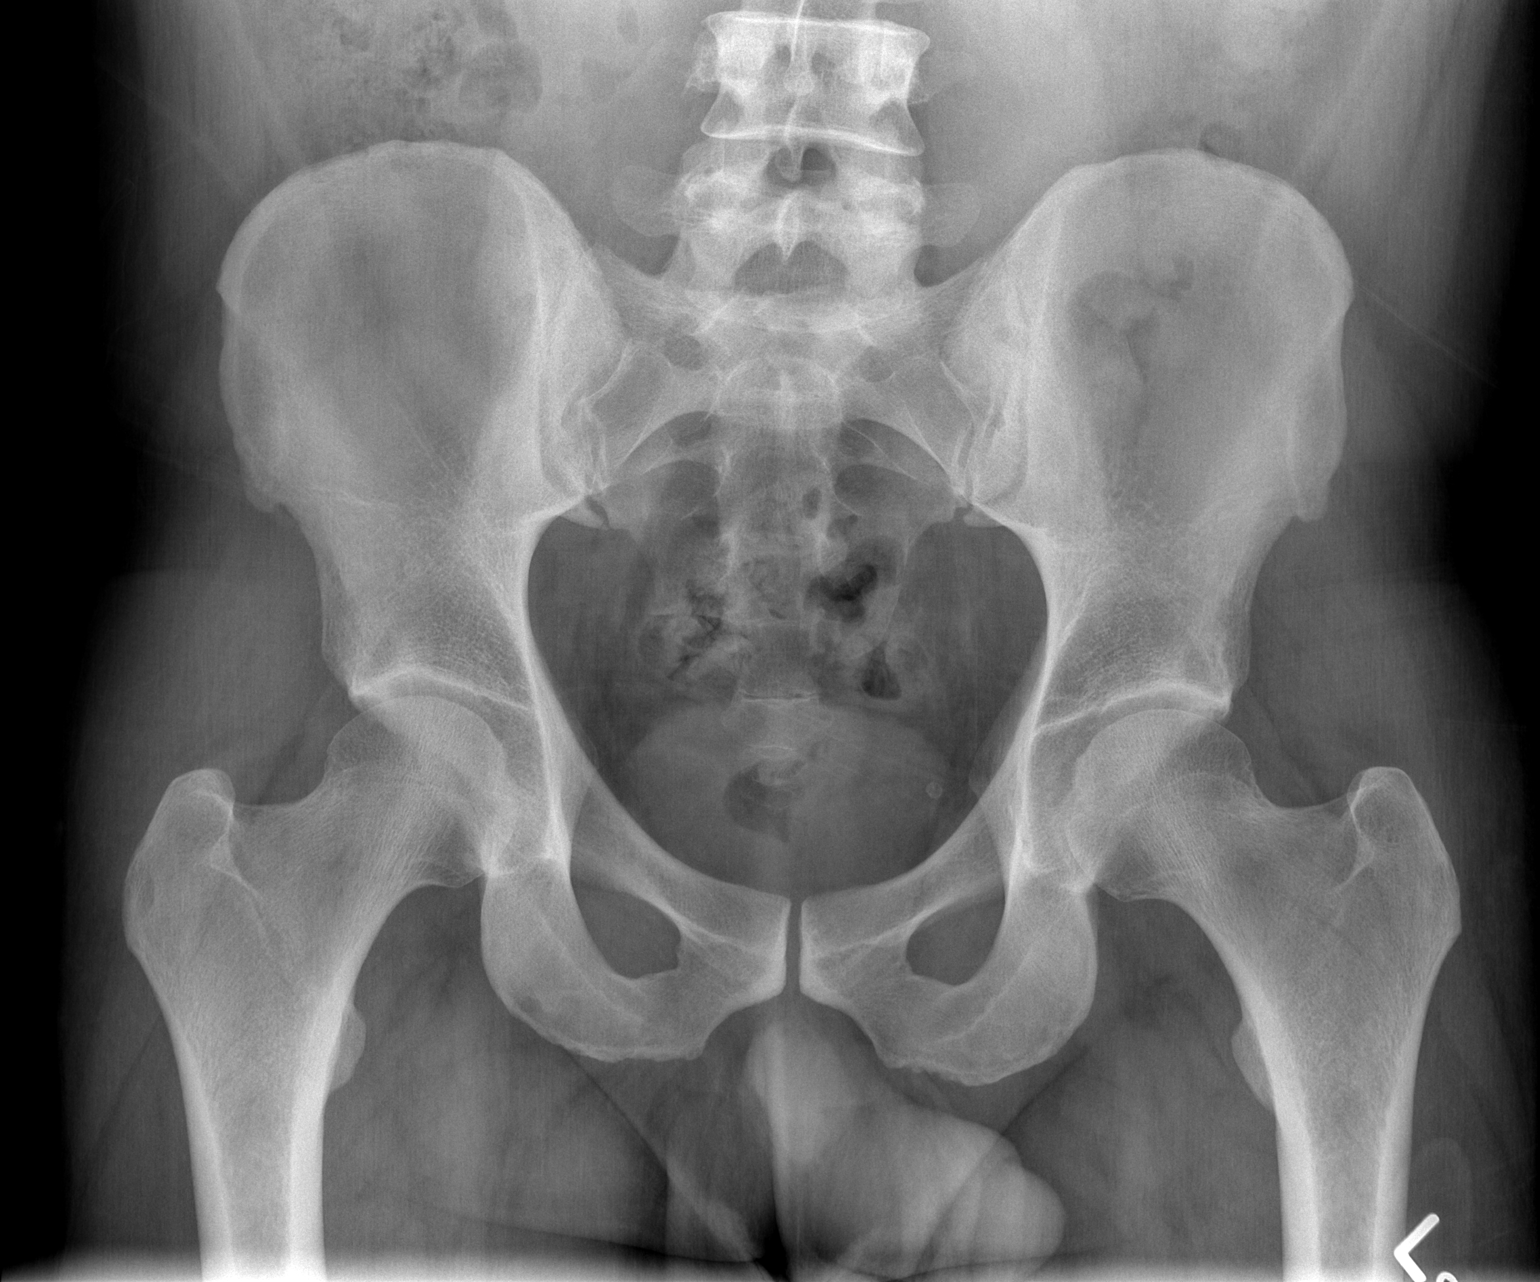

[1 of 1 positions shown; findings below may reference images not displayed]

FINDINGS: There is no evidence of pelvic fracture or diastasis. No other
pelvic bone lesions are seen.
IMPRESSION: Negative.

## 2015-04-06 ENCOUNTER — Ambulatory Visit (INDEPENDENT_AMBULATORY_CARE_PROVIDER_SITE_OTHER): Payer: Commercial Managed Care - HMO | Admitting: Podiatry

## 2015-04-06 ENCOUNTER — Encounter: Payer: Self-pay | Admitting: Podiatry

## 2015-04-06 VITALS — BP 123/89 | HR 87 | Resp 16

## 2015-04-06 DIAGNOSIS — G629 Polyneuropathy, unspecified: Secondary | ICD-10-CM

## 2015-04-06 DIAGNOSIS — D3610 Benign neoplasm of peripheral nerves and autonomic nervous system, unspecified: Secondary | ICD-10-CM

## 2015-04-07 NOTE — Progress Notes (Signed)
Subjective:     Patient ID: Jose [name], male   DOB: 1971/02/01, 44 y.o.   MRN: [id number]  HPI patient has some numbness but at this time states that there has been significant improvement with the medication we've been using   Review of Systems     Objective:   Physical Exam Neurovascular status found to be intact with mild to moderate discomfort when I pressed into the second intermetatarsal space bilateral but dramatic improvement from the preoperative time that we evaluated this patient    Assessment:     Improving neuroma symptomatology bilateral with possibility long-term for moderate neuropathy-like symptomatology    Plan:     Reviewed both conditions and at this time Jose [name] to continue to focus on the neuroma-like symptoms. I did do a neuro lysis injection consisting of purified alcohol Marcaine solution into each second interspace and we will now discharge this patient and less symptoms were to get worse or any issues were to occur

## 2015-07-06 ENCOUNTER — Telehealth: Payer: Self-pay | Admitting: *Deleted

## 2015-07-06 ENCOUNTER — Encounter: Payer: Self-pay | Admitting: *Deleted

## 2015-07-06 NOTE — Telephone Encounter (Signed)
Pre-Visit Call completed with patient and chart updated.   Pre-Visit Info documented in Specialty Comments under [employer].    

## 2015-07-06 NOTE — Telephone Encounter (Signed)
Unable to reach patient at time of pre-visit call. Left message for patient to return call when available.  

## 2015-07-07 ENCOUNTER — Encounter: Payer: Self-pay | Admitting: Internal Medicine

## 2015-07-07 ENCOUNTER — Ambulatory Visit (INDEPENDENT_AMBULATORY_CARE_PROVIDER_SITE_OTHER): Payer: Commercial Managed Care - HMO | Admitting: Internal Medicine

## 2015-07-07 VITALS — BP 116/72 | HR 83 | Temp 97.7°F | Ht 70.0 in | Wt 208.0 lb

## 2015-07-07 DIAGNOSIS — L989 Disorder of the skin and subcutaneous tissue, unspecified: Secondary | ICD-10-CM

## 2015-07-07 DIAGNOSIS — Z114 Encounter for screening for human immunodeficiency virus [HIV]: Secondary | ICD-10-CM

## 2015-07-07 DIAGNOSIS — Z0000 Encounter for general adult medical examination without abnormal findings: Secondary | ICD-10-CM | POA: Diagnosis not present

## 2015-07-07 DIAGNOSIS — E538 Deficiency of other specified B group vitamins: Secondary | ICD-10-CM

## 2015-07-07 NOTE — Patient Instructions (Addendum)
GO TO THE FRONT DESK Schedule labs to be done within few days (fasting)   Schedule a complete physical exam to be done in 1 year Please be fasting   Consider use MiraLAX 17 g daily  Metamucil one or 2 tablets daily

## 2015-07-07 NOTE — Progress Notes (Signed)
Subjective:    Patient ID: Jose [name], male    DOB: 10-25-1970, 45 y.o.   MRN: [id number]  DOS:  07/07/2015 Type of visit - description : CPX Interval history: Has gained some weight but feels well ; has chronic feet paresthesias and currently are under relatively good control   Wt Readings from Last 3 Encounters:  07/07/15 208 lb (94.348 kg)  07/19/14 202 lb 7 oz (91.825 kg)  07/04/14 201 lb (91.173 kg)     Review of Systems Constitutional: No fever. No chills. No unexplained wt changes. No unusual sweats  HEENT: No dental problems, no ear discharge, no facial swelling, no voice changes. No eye discharge, no eye  redness. Mild light  intolerance? Went to see the eye doctor, diagnosed with dry eyes. Respiratory: No wheezing , no  difficulty breathing. No cough , no mucus production  Cardiovascular: No CP, no leg swelling , no  Palpitations  GI: no nausea, no vomiting, no diarrhea. Many years h/o  constipation, has a BM every 2-3  days, sometimes feels bloated..  No blood in the stools. No dysphagia, no odynophagia    Endocrine: No polyphagia, no polyuria , no polydipsia  GU: No dysuria, gross hematuria, difficulty urinating. No urinary urgency, no frequency.  Musculoskeletal: No joint swellings or unusual aches or pains  Skin: No change in the color of the skin, palor , no  Rash  Allergic, immunologic: No environmental allergies , no  food allergies  Neurological: No dizziness no  syncope. No headaches. No diplopia, no slurred, no slurred speech, no motor deficits, no facial  Numbness  Hematological: No enlarged lymph nodes, no easy bruising , no unusual bleedings  Psychiatry: No suicidal ideas, no hallucinations, no beavior problems, no confusion.  No unusual/severe anxiety, no depression   Past Medical History  Diagnosis Date  . Recurrent sinusitis   . Dysphagia     EGD : occult stricture , Bx slt increaed eosinophiles  . RAD (reactive airway disease)     ??      Past Surgical History  Procedure Laterality Date  . No past surgeries      Social History   Social History  . Marital Status: Married    Spouse Name: N/A  . Number of Children: 2  . Years of Education: N/A   Occupational History  . police officer    Social History Main Topics  . Smoking status: Never Smoker   . Smokeless tobacco: Never Used  . Alcohol Use: No  . Drug Use: No  . Sexual Activity: Not on file   Other Topics Concern  . 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     Lives with wife and children in a 2 story home.           Family History  Problem Relation Age of Onset  . Colon cancer Neg Hx   . Prostate cancer Neg Hx   . CAD Neg Hx   . Diabetes Neg Hx   . Arthritis Mother     Living  . Asthma Son   . Healthy Mother     Living  . Thyroid disease Sister       Medication List       This list is accurate as of: 07/07/15 11:59 PM.  Always use your most recent med list.  FISH OIL PO  Take by mouth.     PROBIOTIC DAILY PO  Take by mouth daily.     VITAMIN B 12 PO  Take by mouth. Reported on 07/07/2015           Objective:   Physical Exam BP 116/72 mmHg  Pulse 83  Temp(Src) 97.7 F (36.5 C) (Oral)  Ht 5\' 10"  (1.778 m)  Wt 208 lb (94.348 kg)  BMI 29.84 kg/m2  SpO2 99% General:   Well developed, well nourished . NAD.  Neck:  No  thyromegaly , normal carotid pulse HEENT:  Normocephalic . Face symmetric, atraumatic Lungs:  CTA B Normal respiratory effort, no intercostal retractions, no accessory muscle use. Heart: RRR,  no murmur.  No pretibial edema bilaterally  Abdomen:  Not distended, soft, non-tender. No rebound or rigidity.   Skin: Exposed areas without rash. Not pale. Not jaundice Neurologic:  alert & oriented X3.  Speech normal, gait appropriate for age and unassisted Strength symmetric and appropriate for age.  Psych: Cognition and  judgment appear intact.  Cooperative with normal attention span and concentration.  Behavior appropriate. No anxious or depressed appearing.    Assessment & Plan:   Assessment Recurrent sinusitis ? Reactive airway disease Dysphagia: EGD x 2  >>> occult stricture, s/p stretching , BX increased eosinophils Chronic constipation, no Cscope as off 06-2015 Paresthesias, feet, R>L: onset ~2014, w/u: slt decreased B12, neg MRI back 09-2013,EMG 03-29-14 neg, slt better w/ Morton's neuroma local injection (2016)  Plan 07-07-15 Dysphagia: Not done issue at this time Feet paresthesias: Related to Morton's neuroma? Sx decreaased after local injection, sx are relatively well controlled, if symptoms increase he will let me know, he will consider motor neuroma surgery. B12 deficiency: Takes B12 supplements sporadically because they tend to increase his paresthesias. Check labs Chronic constipation: Recommend MiraLAX daily. This is a lifelong problem, consider GI referral  Skin lesion: at the end of the visit showed me a back lesion >> SK? Refer to dermatology RTC one year

## 2015-07-07 NOTE — Assessment & Plan Note (Signed)
Tdap 2016, had a flu shot   Diet- Exercise discussed Labs: BMP, FLP, CBC, 123456, TSH, 123456, folic acid, HIV

## 2015-07-07 NOTE — Progress Notes (Signed)
Pre visit review using our clinic review tool, if applicable. No additional management support is needed unless otherwise documented below in the visit note. 

## 2015-07-11 ENCOUNTER — Other Ambulatory Visit (INDEPENDENT_AMBULATORY_CARE_PROVIDER_SITE_OTHER): Payer: Commercial Managed Care - HMO

## 2015-07-11 DIAGNOSIS — E538 Deficiency of other specified B group vitamins: Secondary | ICD-10-CM

## 2015-07-11 DIAGNOSIS — Z0000 Encounter for general adult medical examination without abnormal findings: Secondary | ICD-10-CM

## 2015-07-11 DIAGNOSIS — R7989 Other specified abnormal findings of blood chemistry: Secondary | ICD-10-CM | POA: Diagnosis not present

## 2015-07-11 DIAGNOSIS — Z114 Encounter for screening for human immunodeficiency virus [HIV]: Secondary | ICD-10-CM

## 2015-07-11 LAB — CBC WITH DIFFERENTIAL/PLATELET
BASOS PCT: 0.6 % (ref 0.0–3.0)
Basophils Absolute: 0 10*3/uL (ref 0.0–0.1)
Eosinophils Absolute: 0.1 10*3/uL (ref 0.0–0.7)
Eosinophils Relative: 2.2 % (ref 0.0–5.0)
HEMATOCRIT: 42.7 % (ref 39.0–52.0)
Hemoglobin: 14.9 g/dL (ref 13.0–17.0)
LYMPHS PCT: 23.4 % (ref 12.0–46.0)
Lymphs Abs: 1.6 10*3/uL (ref 0.7–4.0)
MCHC: 34.9 g/dL (ref 30.0–36.0)
MCV: 82.8 fl (ref 78.0–100.0)
MONOS PCT: 9.8 % (ref 3.0–12.0)
Monocytes Absolute: 0.7 10*3/uL (ref 0.1–1.0)
NEUTROS ABS: 4.3 10*3/uL (ref 1.4–7.7)
Neutrophils Relative %: 64 % (ref 43.0–77.0)
PLATELETS: 330 10*3/uL (ref 150.0–400.0)
RBC: 5.16 Mil/uL (ref 4.22–5.81)
RDW: 13.1 % (ref 11.5–15.5)
WBC: 6.8 10*3/uL (ref 4.0–10.5)

## 2015-07-11 LAB — BASIC METABOLIC PANEL
BUN: 10 mg/dL (ref 6–23)
CO2: 24 mEq/L (ref 19–32)
CREATININE: 0.76 mg/dL (ref 0.40–1.50)
Calcium: 9.2 mg/dL (ref 8.4–10.5)
Chloride: 105 mEq/L (ref 96–112)
GFR: 119.25 mL/min (ref >60.00)
Glucose, Bld: 94 mg/dL (ref 70–99)
POTASSIUM: 4 meq/L (ref 3.5–5.1)
Sodium: 139 mEq/L (ref 135–145)

## 2015-07-11 LAB — HEMOGLOBIN A1C: Hgb A1c MFr Bld: 5.4 % (ref 4.6–6.5)

## 2015-07-11 LAB — LIPID PANEL
CHOLESTEROL: 171 mg/dL (ref 0–200)
HDL: 37.1 mg/dL — ABNORMAL LOW (ref >39.00)
NonHDL: 133.46
Total CHOL/HDL Ratio: 5
Triglycerides: 218 mg/dL — ABNORMAL HIGH (ref 0.0–149.0)
VLDL: 43.6 mg/dL — AB (ref 0.0–40.0)

## 2015-07-11 LAB — VITAMIN B12: Vitamin B-12: 513 pg/mL (ref 211–911)

## 2015-07-11 LAB — LDL CHOLESTEROL, DIRECT: Direct LDL: 94 mg/dL

## 2015-07-11 LAB — TSH: TSH: 1.42 u[IU]/mL (ref 0.35–4.50)

## 2015-07-11 LAB — FOLATE: FOLATE: 12.9 ng/mL (ref >5.9)

## 2015-07-12 LAB — HIV ANTIBODY (ROUTINE TESTING W REFLEX): HIV: NONREACTIVE

## 2016-07-10 ENCOUNTER — Encounter: Payer: Self-pay | Admitting: Internal Medicine

## 2016-07-10 ENCOUNTER — Ambulatory Visit (INDEPENDENT_AMBULATORY_CARE_PROVIDER_SITE_OTHER): Payer: Commercial Managed Care - HMO | Admitting: Internal Medicine

## 2016-07-10 VITALS — BP 126/78 | HR 81 | Temp 97.9°F | Resp 12 | Ht 70.0 in | Wt 213.5 lb

## 2016-07-10 DIAGNOSIS — L989 Disorder of the skin and subcutaneous tissue, unspecified: Secondary | ICD-10-CM

## 2016-07-10 DIAGNOSIS — Z0000 Encounter for general adult medical examination without abnormal findings: Secondary | ICD-10-CM

## 2016-07-10 LAB — BASIC METABOLIC PANEL
BUN: 9 mg/dL (ref 6–23)
CO2: 28 mEq/L (ref 19–32)
Calcium: 8.9 mg/dL (ref 8.4–10.5)
Chloride: 106 mEq/L (ref 96–112)
Creatinine, Ser: 0.8 mg/dL (ref 0.40–1.50)
GFR: 111.87 mL/min (ref >60.00)
Glucose, Bld: 89 mg/dL (ref 70–99)
Potassium: 4.1 mEq/L (ref 3.5–5.1)
SODIUM: 138 meq/L (ref 135–145)

## 2016-07-10 LAB — AST: AST: 20 U/L (ref 0–37)

## 2016-07-10 LAB — LIPID PANEL
CHOLESTEROL: 160 mg/dL (ref 0–200)
HDL: 39.6 mg/dL (ref >39.00)
LDL Cholesterol: 88 mg/dL (ref 0–99)
NonHDL: 120.09
TRIGLYCERIDES: 161 mg/dL — AB (ref 0.0–149.0)
Total CHOL/HDL Ratio: 4
VLDL: 32.2 mg/dL (ref 0.0–40.0)

## 2016-07-10 LAB — ALT: ALT: 26 U/L (ref 0–53)

## 2016-07-10 NOTE — Patient Instructions (Signed)
GO TO THE LAB : Get the blood work     GO TO THE FRONT DESK Schedule your next appointment for a  Physical in 1 year  

## 2016-07-10 NOTE — Progress Notes (Signed)
Subjective:    Patient ID: Jose [name], male    DOB: [birth year], 46 y.o.   MRN: [id number]  DOS:  07/10/2016 Type of visit - description : cpx Interval history:No major concerns  Wt Readings from Last 3 Encounters:  07/10/16 213 lb 8 oz (96.8 kg)  07/07/15 208 lb (94.3 kg)  07/19/14 202 lb 7 oz (91.8 kg)     Review of Systems Constitutional: No fever. No chills. No unexplained wt changes. No unusual sweats  HEENT: No dental problems, no ear discharge, no facial swelling, no voice changes. No eye discharge, no eye  redness , no  intolerance to light   Respiratory: No wheezing , no  difficulty breathing. No cough , no mucus production  Cardiovascular: No CP, no leg swelling , no  Palpitations  GI:  Constipation controlled with MiraLAX no nausea, no vomiting, no diarrhea , no  abdominal pain.  No blood in the stools. No dysphagia, no odynophagia    Endocrine: No polyphagia, no polyuria , no polydipsia  GU: No dysuria, gross hematuria, difficulty urinating. No urinary urgency, no frequency.  Musculoskeletal: No joint swellings or unusual aches or pains  Skin: No change in the color of the skin, palor , no  Rash  Allergic, immunologic: No environmental allergies , no  food allergies  Neurological:  Continue with feet paresthesias on and off No dizziness no  syncope. No headaches. No diplopia, no slurred, no slurred speech, no motor deficits, no facial  Numbness  Hematological: No enlarged lymph nodes, no easy bruising , no unusual bleedings  Psychiatry: No suicidal ideas, no hallucinations, no beavior problems, no confusion.  No unusual/severe anxiety, no depression   Past Medical History:  Diagnosis Date  . Dysphagia    EGD : occult stricture , Bx slt increaed eosinophiles  . RAD (reactive airway disease)    ??  . Recurrent sinusitis     Past Surgical History:  Procedure Laterality Date  . NO PAST SURGERIES      Social History   Social History  .  Marital status: Married    Spouse name: N/A  . Number of children: 2  . Years of education: N/A   Occupational History  . police officer    Social History Main Topics  . Smoking status: Never Smoker  . Smokeless tobacco: Never Used  . Alcohol use No     Comment: rare   . Drug use: No  . Sexual activity: Not on file   Other Topics Concern  . 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  18 and 38 y/o   Lives with wife and children in a 2 story home.           Family History  Problem Relation Age of Onset  . Arthritis Mother     Living  . Healthy Mother     Living  . Asthma Son   . Thyroid disease Sister   . Colon cancer Neg Hx   . Prostate cancer Neg Hx   . CAD Neg Hx   . Diabetes Neg Hx      Allergies as of 07/10/2016      Reactions   Nexium [esomeprazole Magnesium] Nausea Only      Medication List       Accurate as of 07/10/16 11:59 PM. Always use your most recent med list.  FISH OIL PO Take by mouth.   PROBIOTIC DAILY PO Take by mouth daily.   VITAMIN B 12 PO Take by mouth. Reported on 07/07/2015          Objective:   Physical Exam  Skin:      BP 126/78 (BP Location: Left Arm, Patient Position: Sitting, Cuff Size: Normal)   Pulse 81   Temp 97.9 F (36.6 C) (Oral)   Resp 12   Ht 5\' 10"  (1.778 m)   Wt 213 lb 8 oz (96.8 kg)   SpO2 99%   BMI 30.63 kg/m   General:   Well developed, well nourished . NAD.  Neck: No  thyromegaly  HEENT:  Normocephalic . Face symmetric, atraumatic Lungs:  CTA B Normal respiratory effort, no intercostal retractions, no accessory muscle use. Heart: RRR,  no murmur.  No pretibial edema bilaterally  Abdomen:  Not distended, soft, non-tender. No rebound or rigidity.   Skin: Exposed areas without rash. Not pale. Not jaundice Neurologic:  alert & oriented X3.  Speech normal, gait appropriate for age and unassisted Strength symmetric and  appropriate for age.  Psych: Cognition and judgment appear intact.  Cooperative with normal attention span and concentration.  Behavior appropriate. No anxious or depressed appearing.    Assessment & Plan:   Assessment Recurrent sinusitis ? 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 Paresthesias: On and off issue, currently okay, takes B12 supplements from time to time Skin lesion: Has a "frikle"  that is larger and darker at the inner aspect of the left arm, refer to dermatology, recommend to call me if he does not hear about the referral within a week. RTC one year

## 2016-07-10 NOTE — Progress Notes (Signed)
Pre visit review using our clinic review tool, if applicable. No additional management support is needed unless otherwise documented below in the visit note. 

## 2016-07-10 NOTE — Assessment & Plan Note (Addendum)
Tdap 2016, had a flu shot  Weight gain noted, we discussed the need for a healthier diet and exercise. Good option for exercise would be a recumbent bike if he has chronic feet pain. Labs: BMP, AST, ALT, FLP

## 2016-07-11 DIAGNOSIS — Z09 Encounter for follow-up examination after completed treatment for conditions other than malignant neoplasm: Secondary | ICD-10-CM | POA: Insufficient documentation

## 2016-07-11 NOTE — Assessment & Plan Note (Signed)
Chronic constipation: Much improved with MiraLAX, takes it almost daily, had dyspepsia with Metamucil. Paresthesias: On and off issue, currently okay, takes B12 supplements from time to time Skin lesion: Has a "frikle"  that is larger and darker at the inner aspect of the left arm, refer to dermatology, recommend to call me if he does not hear about the referral within a week. RTC one year

## 2016-08-09 DIAGNOSIS — B079 Viral wart, unspecified: Secondary | ICD-10-CM | POA: Diagnosis not present

## 2016-08-09 DIAGNOSIS — D225 Melanocytic nevi of trunk: Secondary | ICD-10-CM | POA: Diagnosis not present

## 2016-08-09 DIAGNOSIS — L814 Other melanin hyperpigmentation: Secondary | ICD-10-CM | POA: Diagnosis not present

## 2016-08-09 DIAGNOSIS — L821 Other seborrheic keratosis: Secondary | ICD-10-CM | POA: Diagnosis not present

## 2017-02-24 ENCOUNTER — Ambulatory Visit (HOSPITAL_BASED_OUTPATIENT_CLINIC_OR_DEPARTMENT_OTHER)
Admission: RE | Admit: 2017-02-24 | Discharge: 2017-02-24 | Disposition: A | Discharge status: Home / Self Care | Payer: 59 | Source: Ambulatory Visit | Attending: Internal Medicine | Admitting: Internal Medicine

## 2017-02-24 ENCOUNTER — Encounter: Payer: Self-pay | Admitting: Internal Medicine

## 2017-02-24 ENCOUNTER — Encounter (HOSPITAL_BASED_OUTPATIENT_CLINIC_OR_DEPARTMENT_OTHER): Payer: Self-pay

## 2017-02-24 ENCOUNTER — Ambulatory Visit (INDEPENDENT_AMBULATORY_CARE_PROVIDER_SITE_OTHER): Payer: 59 | Admitting: Internal Medicine

## 2017-02-24 VITALS — BP 118/80 | HR 73 | Temp 98.1°F | Resp 14 | Ht 70.0 in | Wt 208.0 lb

## 2017-02-24 DIAGNOSIS — K828 Other specified diseases of gallbladder: Secondary | ICD-10-CM

## 2017-02-24 DIAGNOSIS — K5909 Other constipation: Secondary | ICD-10-CM | POA: Insufficient documentation

## 2017-02-24 DIAGNOSIS — R1011 Right upper quadrant pain: Secondary | ICD-10-CM | POA: Diagnosis not present

## 2017-02-24 DIAGNOSIS — K829 Disease of gallbladder, unspecified: Secondary | ICD-10-CM | POA: Diagnosis not present

## 2017-02-24 DIAGNOSIS — K760 Fatty (change of) liver, not elsewhere classified: Secondary | ICD-10-CM | POA: Diagnosis not present

## 2017-02-24 LAB — URINALYSIS, ROUTINE W REFLEX MICROSCOPIC
BILIRUBIN URINE: NEGATIVE
HGB URINE DIPSTICK: NEGATIVE
KETONES UR: 15 — AB
LEUKOCYTES UA: NEGATIVE
NITRITE: NEGATIVE
RBC / HPF: NONE SEEN (ref 0–?)
Specific Gravity, Urine: 1.01 (ref 1.000–1.030)
Total Protein, Urine: NEGATIVE
Urine Glucose: NEGATIVE
Urobilinogen, UA: 0.2 (ref 0.0–1.0)
pH: 7 (ref 5.0–8.0)

## 2017-02-24 LAB — CBC WITH DIFFERENTIAL/PLATELET
BASOS ABS: 0.1 10*3/uL (ref 0.0–0.1)
Basophils Relative: 0.8 % (ref 0.0–3.0)
EOS ABS: 0 10*3/uL (ref 0.0–0.7)
Eosinophils Relative: 0.7 % (ref 0.0–5.0)
HEMATOCRIT: 43.6 % (ref 39.0–52.0)
HEMOGLOBIN: 15 g/dL (ref 13.0–17.0)
LYMPHS PCT: 17.1 % (ref 12.0–46.0)
Lymphs Abs: 1.3 10*3/uL (ref 0.7–4.0)
MCHC: 34.5 g/dL (ref 30.0–36.0)
MCV: 85.3 fl (ref 78.0–100.0)
Monocytes Absolute: 0.6 10*3/uL (ref 0.1–1.0)
Monocytes Relative: 7.5 % (ref 3.0–12.0)
Neutro Abs: 5.4 10*3/uL (ref 1.4–7.7)
Neutrophils Relative %: 73.9 % (ref 43.0–77.0)
Platelets: 331 10*3/uL (ref 150.0–400.0)
RBC: 5.11 Mil/uL (ref 4.22–5.81)
RDW: 13.8 % (ref 11.5–15.5)
WBC: 7.4 10*3/uL (ref 4.0–10.5)

## 2017-02-24 LAB — COMPREHENSIVE METABOLIC PANEL
ALBUMIN: 4.5 g/dL (ref 3.5–5.2)
ALK PHOS: 73 U/L (ref 39–117)
ALT: 24 U/L (ref 0–53)
AST: 17 U/L (ref 0–37)
BUN: 12 mg/dL (ref 6–23)
CALCIUM: 9.4 mg/dL (ref 8.4–10.5)
CO2: 26 mEq/L (ref 19–32)
CREATININE: 0.75 mg/dL (ref 0.40–1.50)
Chloride: 102 mEq/L (ref 96–112)
GFR: 120.17 mL/min (ref >60.00)
Glucose, Bld: 85 mg/dL (ref 70–99)
Potassium: 4.2 mEq/L (ref 3.5–5.1)
Sodium: 137 mEq/L (ref 135–145)
TOTAL PROTEIN: 7.3 g/dL (ref 6.0–8.3)
Total Bilirubin: 0.7 mg/dL (ref 0.2–1.2)

## 2017-02-24 LAB — TSH: TSH: 1.1 u[IU]/mL (ref 0.35–4.50)

## 2017-02-24 NOTE — Progress Notes (Signed)
Subjective:    Patient ID: Jose [name], male    DOB: 1970-12-09, 46 y.o.   MRN: [id number]  DOS:  02/24/2017 Type of visit - description : acute Interval history: Was doing okay taking daily MiraLAX for chronic constipation until 2-3 weeks ago when he started feeling bloated, mostly at the upper abdomen, symptoms described as moderate to severe. He felt MiraLAX was causing that and discontinue it He also had mild heartburn and pain at the abdomen, RUQ>LUQ on and off. Pain was not necessarily related with food, lasted several hours during the day, no symptoms at night. He changed his diet, is trying to keep himself hydrated, he stopped sodas,has lost some weight (he thinks due to dietary changes). He also has on and off lower abdominal discomfort and wonders if he has a UTI.   Review of Systems No fever chills No nausea, vomiting, diarrhea blood in the stools. He very seldom has dysphagia for years, no odynophagia No dysuria, gross hematuria difficulty urinating. Again BMs are now daily but not "satisfactory"  Past Medical History:  Diagnosis Date  . Dysphagia    EGD : occult stricture , Bx slt increaed eosinophiles  . RAD (reactive airway disease)    ??  . Recurrent sinusitis     Past Surgical History:  Procedure Laterality Date  . NO PAST SURGERIES      Social History   Social History  . Marital status: Married    Spouse name: N/A  . Number of children: 2  . Years of education: N/A   Occupational History  . police officer    Social History Main Topics  . Smoking status: Never Smoker  . Smokeless tobacco: Never Used  . Alcohol use No     Comment: rare   . Drug use: No  . Sexual activity: Not on file   Other Topics Concern  . 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  40 and 29 y/o   Lives with wife and children in a 2 story home.            Allergies as of 02/24/2017      Reactions   Nexium [esomeprazole Magnesium] Nausea Only      Medication List    as of 02/24/2017 11:59 PM   You have not been prescribed any medications.        Objective:   Physical Exam BP 118/80 (BP Location: Left Arm, Patient Position: Sitting, Cuff Size: Small)   Pulse 73   Temp 98.1 F (36.7 C) (Oral)   Resp 14   Ht 5\' 10"  (1.778 m)   Wt 208 lb (94.3 kg)   SpO2 98%   BMI 29.84 kg/m  General:   Well developed, well nourished . NAD.  HEENT:  Normocephalic . Face symmetric, atraumatic. Conjunctiva: Not pale or jaundice Lungs:  CTA B Normal respiratory effort, no intercostal retractions, no accessory muscle use. Heart: RRR,  no murmur.  no pretibial edema bilaterally  Abdomen:  Not distended, soft, non-tender. No rebound or rigidity.  Skin: Not pale. Not jaundice Neurologic:  alert & oriented X3.  Speech normal, gait appropriate for age and unassisted Psych--  Cognition and judgment appear intact.  Cooperative with normal attention span and concentration.  Behavior appropriate. No anxious or depressed appearing.    Assessment & Plan:     Assessment Recurrent sinusitis ? Reactive airway disease  Dysphagia: EGD x 2  >>> occult stricture, s/p stretching , BX increased eosinophils Chronic constipation, no Cscope as off 06-2015 Paresthesias, feet, R>L: onset ~2014, w/u: slt decreased B12, neg MRI back 09-2013,EMG 03-29-14 neg, slt better w/ Morton's neuroma local injection (2016)  PLAN: Multiple GI symptoms: Feeling bloated. Upper and lower abdominal pain, chronic constipation. The sx that is more bothersome to him is feeling bloated. He has a history of chronic constipation. Plan: CBC TSH and CMP. Check a UA although I doubt he has a UTI. Ultrasound of the abdomen., Continue MiraLAX as needed Refer to GI, given multiple symptoms I wonder if he needs colonoscopy and possibly initiation of medical treatment for chronic constipation (amitiza?). rtc 2 -2019

## 2017-02-24 NOTE — Patient Instructions (Addendum)
GO TO THE LAB : Get the blood work     GO TO THE FRONT DESK Schedule your next appointment for a  physical exam by 06-2017  We are referring you for eye evaluation with a gastroenterologist  We are scheduling ultrasound of the abdomen

## 2017-02-24 NOTE — Progress Notes (Signed)
Pre visit review using our clinic review tool, if applicable. No additional management support is needed unless otherwise documented below in the visit note. 

## 2017-02-25 NOTE — Assessment & Plan Note (Signed)
Multiple GI symptoms: Feeling bloated. Upper and lower abdominal pain, chronic constipation. The sx that is more bothersome to him is feeling bloated. He has a history of chronic constipation. Plan: CBC TSH and CMP. Check a UA although I doubt he has a UTI. Ultrasound of the abdomen., Continue MiraLAX as needed Refer to GI, given multiple symptoms I wonder if he needs colonoscopy and possibly initiation of medical treatment for chronic constipation (amitiza?). rtc 2 -2019

## 2017-02-26 NOTE — Addendum Note (Signed)
Addended byDamita Dunnings D on: 02/26/2017 05:15 PM   Modules accepted: Orders

## 2017-02-28 ENCOUNTER — Encounter: Payer: Self-pay | Admitting: Gastroenterology

## 2017-02-28 ENCOUNTER — Ambulatory Visit (INDEPENDENT_AMBULATORY_CARE_PROVIDER_SITE_OTHER): Payer: 59 | Admitting: Gastroenterology

## 2017-02-28 VITALS — BP 112/72 | HR 76 | Ht 69.5 in | Wt 209.1 lb

## 2017-02-28 DIAGNOSIS — K5900 Constipation, unspecified: Secondary | ICD-10-CM

## 2017-02-28 DIAGNOSIS — R140 Abdominal distension (gaseous): Secondary | ICD-10-CM | POA: Diagnosis not present

## 2017-02-28 DIAGNOSIS — R1011 Right upper quadrant pain: Secondary | ICD-10-CM

## 2017-02-28 MED ORDER — NA SULFATE-K SULFATE-MG SULF 17.5-3.13-1.6 GM/177ML PO SOLN: 1.0000 | Freq: Once | ORAL | 0 refills | Status: AC

## 2017-02-28 NOTE — Patient Instructions (Signed)
If you are age 44 or older, your body mass index should be between 23-30. Your Body mass index is 30.44 kg/m. If this is out of the aforementioned range listed, please consider follow up with your Primary Care Provider.  If you are age 55 or younger, your body mass index should be between 19-25. Your Body mass index is 30.44 kg/m. If this is out of the aformentioned range listed, please consider follow up with your Primary Care Provider.   You have been scheduled for a colonoscopy. Please follow written instructions given to you at your visit today.  Please pick up your prep supplies at the pharmacy within the next 1-3 days. If you use inhalers (even only as needed), please bring them with you on the day of your procedure. Your physician has requested that you go to www.startemmi.com and enter the access code given to you at your visit today. This web site gives a general overview about your procedure. However, you should still follow specific instructions given to you by our office regarding your preparation for the procedure.  Thank you for choosing [facility]  Dr Wilfrid Lund III

## 2017-02-28 NOTE — Progress Notes (Addendum)
  [facility] Consult Note:  History: Jose [name] 02/28/2017  Referring physician: Paz, Jose E, MD  Reason for consult/chief complaint: Bloated; Abdominal Pain (RUQ and LUQ pain, ? constipation or gallbladder); and Abnormal US   Subjective  HPI:  This is a 46-year-old man referred by primary care noted above for abdominal pain. He started seeing this practice about a decade ago, and most recently saw Jose [name] in June 2014. At that point he was having dysphagia and an upper endoscopy was performed. There was an empiric dilation, though no stricture seen. An upper endoscopy with dilation had been done in 2009 as well. Jose [name] describes years of heartburn precipitated by certain foods such as soda or certain kinds of beans. He has had intermittent constipation and a bloated upper abdominal discomfort. It seemed that over the last month the upper abdominal bloating was worse for unclear reasons. He was given MiraLAX and fiber, but stopped the fiber because it seemed to worsen the bloating. He was having some left upper quadrant pain that had occurred on and off for years, but then started having a dull right upper quadrant pain that he sometimes felt in the back. That was not following any clear pattern such as after meals, position or time of day. This prompted an ultrasound described below. He was referred to both us and surgery after that.   ROS:  Review of Systems  Constitutional: Negative for appetite change and unexpected weight change.  HENT: Negative for mouth sores and voice change.   Eyes: Negative for pain and redness.  Respiratory: Negative for cough and shortness of breath.   Cardiovascular: Negative for chest pain and palpitations.  Genitourinary: Negative for dysuria and hematuria.  Musculoskeletal: Negative for arthralgias and myalgias.  Skin: Negative for pallor and rash.  Neurological: Negative for weakness and headaches.  Hematological: Negative  for adenopathy.     Past Medical History: Past Medical History:  Diagnosis Date  . Dysphagia    EGD : occult stricture , Bx slt increaed eosinophiles  . Morton's neuroma   . RAD (reactive airway disease)    ??  . Recurrent sinusitis      Past Surgical History: Past Surgical History:  Procedure Laterality Date  . NO PAST SURGERIES       Family History: Family History  Problem Relation Age of Onset  . Arthritis Mother        Living  . Healthy Mother        Living  . Asthma Son   . Thyroid disease Sister   . Colon cancer Neg Hx   . Prostate cancer Neg Hx   . CAD Neg Hx   . Diabetes Neg Hx     Social History: Social History   Social History  . Marital status: Married    Spouse name: N/A  . Number of children: 2  . Years of education: N/A   Occupational History  . police officer    Social History Main Topics  . Smoking status: Never Smoker  . Smokeless tobacco: Never Used  . Alcohol use No     Comment: rare   . Drug use: No  . Sexual activity: Not Asked   Other Topics Concern  . 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  8 and 11 y/o   Lives with wife and children in a 2 story home.           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 Nexium [Esomeprazole Magnesium] Nausea Only   [name] believes that Nexium caused him to develop B12 deficiency with neurologic symptoms.  Outpatient Meds: Current Outpatient Prescriptions  Medication Sig Dispense Refill  . polyethylene glycol powder (GLYCOLAX/MIRALAX) powder Take 17 g by mouth daily.    . Na Sulfate-K Sulfate-Mg Sulf 17.5-3.13-1.6 GM/180ML SOLN Take 1 kit by mouth once. 354 mL 0   No current facility-administered medications for this visit.       ___________________________________________________________________ Objective   Exam:  BP 112/72 (BP Location: Left Arm, Patient Position: Sitting, Cuff Size:  Normal)   Pulse 76   Ht 5' 9.5" (1.765 m) Comment: height measured without shoes  Wt 209 lb 2 oz (94.9 kg)   BMI 30.44 kg/m    General: this is a(n) Well-appearing man   Eyes: sclera anicteric, no redness  ENT: oral mucosa moist without lesions, no cervical or supraclavicular lymphadenopathy, good dentition  CV: RRR without murmur, S1/S2, no JVD, no peripheral edema  Resp: clear to auscultation bilaterally, normal RR and effort noted  GI: soft, overweight, no tenderness, with active bowel sounds. No guarding or palpable organomegaly noted.  Skin; warm and dry, no rash or jaundice noted  Neuro: awake, alert and oriented x 3. Normal gross motor function and fluent speech  Labs:  CBC Latest Ref Rng & Units 02/24/2017 07/11/2015 07/29/2013  WBC 4.0 - 10.5 K/uL 7.4 6.8 5.6  Hemoglobin 13.0 - 17.0 g/dL 15.0 14.9 15.3  Hematocrit 39.0 - 52.0 % 43.6 42.7 43.5  Platelets 150.0 - 400.0 K/uL 331.0 330.0 307   CMP Latest Ref Rng & Units 02/24/2017 07/10/2016 07/11/2015  Glucose 70 - 99 mg/dL 85 89 94  BUN 6 - 23 mg/dL _0 Creatinine 0.40 - 1.50 mg/dL 0.75 0.80 0.76  Sodium 135 - 145 mEq/L 137 138 139  Potassium 3.5 - 5.1 mEq/L 4.2 4.1 4.0  Chloride 96 - 112 mEq/L 102 106 105  CO2 19 - 32 mEq/L _1 Calcium 8.4 - 10.5 mg/dL 9.4 8.9 9.2  Total Protein 6.0 - 8.3 g/dL 7.3 - -  Total Bilirubin 0.2 - 1.2 mg/dL 0.7 - -  Alkaline Phos 39 - 117 U/L 73 - -  AST 0 - 37 U/L 17 20 -  ALT 0 - 53 U/L 24 26 -     Radiologic Studies:  Mildly contracted GB, ? Small amount pericholecystic fluid.  No stones  Assessment: Encounter Diagnoses  Name Primary?  . Abdominal bloating Yes  . Constipation, unspecified constipation type   . RUQ pain     His symptoms are somewhat difficult to characterize. He does seem to have ongoing constipation, I wonder if this may be constipation predominant IBS. The bloated upper abdominal discomfort does not sound typical for gallbladder, and I have  advised him to hold off on seeing the surgeon. He does not have risk factors for gastroparesis, I wonder about nonulcer dyspepsia. He feels that perhaps there is been some improvement in symptoms lately with efforts at a healthier diet and weight loss. It is not clear why his symptoms seem to have worsened recently. Plan:  Colonoscopy, he is agreeable  The benefits and risks of the planned procedure were described in detail with the patient or (when appropriate) their health care proxy.  Risks were outlined as including, but not limited to, bleeding, infection, perforation, adverse medication reaction leading to cardiac or pulmonary decompensation, or  pancreatitis (if ERCP).  The limitation of incomplete mucosal visualization was also discussed.  No guarantees or warranties were given.  Depending upon results, perhaps a trial of Linzess/Amitiza if he is willing, perhaps further imaging. He does not seem overly enthusiastic about prescription medicines.  Thank you for the courtesy of this consult.  Please call me with any questions or concerns.  Nelida Meuse III  CC: Colon Branch, MD

## 2017-03-03 ENCOUNTER — Encounter: Payer: Self-pay | Admitting: Gastroenterology

## 2017-03-04 DIAGNOSIS — L821 Other seborrheic keratosis: Secondary | ICD-10-CM | POA: Diagnosis not present

## 2017-03-04 DIAGNOSIS — L570 Actinic keratosis: Secondary | ICD-10-CM | POA: Diagnosis not present

## 2017-03-04 DIAGNOSIS — L814 Other melanin hyperpigmentation: Secondary | ICD-10-CM | POA: Diagnosis not present

## 2017-03-12 ENCOUNTER — Ambulatory Visit (AMBULATORY_SURGERY_CENTER): Payer: 59 | Admitting: Gastroenterology

## 2017-03-12 ENCOUNTER — Telehealth: Payer: Self-pay | Admitting: Gastroenterology

## 2017-03-12 ENCOUNTER — Encounter: Payer: Self-pay | Admitting: Gastroenterology

## 2017-03-12 VITALS — BP 117/74 | HR 79 | Temp 97.7°F | Resp 15 | Ht 69.0 in | Wt 209.0 lb

## 2017-03-12 DIAGNOSIS — R140 Abdominal distension (gaseous): Secondary | ICD-10-CM

## 2017-03-12 DIAGNOSIS — R1010 Upper abdominal pain, unspecified: Secondary | ICD-10-CM

## 2017-03-12 DIAGNOSIS — D122 Benign neoplasm of ascending colon: Secondary | ICD-10-CM

## 2017-03-12 DIAGNOSIS — K5900 Constipation, unspecified: Secondary | ICD-10-CM

## 2017-03-12 MED ORDER — SODIUM CHLORIDE 0.9 % IV SOLN: 500.0000 mL | INTRAVENOUS | Status: DC

## 2017-03-12 NOTE — Progress Notes (Signed)
Pt given handout to fill out with appointment for CT of abdomen and pelvis and 2 bots of contrast.  No complaints noted in the recovery room. maw

## 2017-03-12 NOTE — Telephone Encounter (Signed)
Please schedule a CT scan abdomen and pelvis with oral and IV contrast for RUQ abd pain.  He received oral contrast in the [facility] after his EGD.

## 2017-03-12 NOTE — Progress Notes (Signed)
Transfer to PACU, Report to RN, VSS. Tb

## 2017-03-12 NOTE — Op Note (Addendum)
[facility] Patient Name: Jose [name] Procedure Date: 03/12/2017 3:01 PM MRN: [id number] Endoscopist: Mallie Mussel L. Loletha Carrow , MD Age: 46 Referring MD:  Date of Birth: 28-Jan-1971 Gender: Male Account #: [id number] Procedure:                Colonoscopy Indications:              Upper abdominal pain, Constipation, Bloating Medicines:                Monitored Anesthesia Care Procedure:                Pre-Anesthesia Assessment:                           - Prior to the procedure, a History and Physical                            was performed, and patient medications and                            allergies were reviewed. The patient's tolerance of                            previous anesthesia was also reviewed. The risks                            and benefits of the procedure and the sedation                            options and risks were discussed with the patient.                            All questions were answered, and informed consent                            was obtained. Prior Anticoagulants: The patient has                            taken no previous anticoagulant or antiplatelet                            agents. ASA Grade Assessment: II - A patient with                            mild systemic disease. After reviewing the risks                            and benefits, the patient was deemed in                            satisfactory condition to undergo the procedure.                           The colonoscopy was performed without difficulty.  The patient tolerated the procedure well. The scope                            was inserted through the anus and advanced to the                            terminal ileum without difficulty. Technical                            problems precluded photographs. The quality of the                            bowel preparation was excellent using Suprep. Scope In: 3:26:58 PM Scope Out: 3:41:02 PM Scope  Withdrawal Time: 0 hours 9 minutes 0 seconds  Total Procedure Duration: 0 hours 14 minutes 4 seconds  Findings:                 The perianal and digital rectal examinations were                            normal.                           The terminal ileum appeared normal.                           A 4 mm polyp was found in the distal ascending                            colon. The polyp was sessile. The polyp was removed                            with a cold snare. Resection and retrieval were                            complete.                           The exam was otherwise without abnormality on                            direct and retroflexion views. Complications:            No immediate complications. Estimated Blood Loss:     Estimated blood loss: none. Impression:               - The examined portion of the ileum was normal.                           - One 4 mm polyp in the distal ascending colon,                            removed with a cold snare. Resected and retrieved.                           -  The examination was otherwise normal on direct                            and retroflexion views. Recommendation:           - Patient has a contact number available for                            emergencies. The signs and symptoms of potential                            delayed complications were discussed with the                            patient. Return to normal activities tomorrow.                            Written discharge instructions were provided to the                            patient.                           - Resume previous diet.                           - Continue present medications.                           - Await pathology results.                           - Repeat colonoscopy is recommended for                            surveillance. The colonoscopy date will be                            determined after pathology results from today's                             exam become available for review.                           - Schedule CT abdomen/pelvis. Amica Harron L. Loletha Carrow, MD 03/12/2017 3:05:48 PM This report has been signed electronically.

## 2017-03-12 NOTE — Progress Notes (Signed)
Called to room to assist during endoscopic procedure.  Patient ID and intended procedure confirmed with present staff. Received instructions for my participation in the procedure from the performing physician.  

## 2017-03-12 NOTE — Patient Instructions (Signed)
YOU HAD AN ENDOSCOPIC PROCEDURE TODAY AT [facility]:   Refer to the procedure report that was given to you for any specific questions about what was found during the examination.  If the procedure report does not answer your questions, please call your gastroenterologist to clarify.  If you requested that your care partner not be given the details of your procedure findings, then the procedure report has been included in a sealed envelope for you to review at your convenience later.  YOU SHOULD EXPECT: Some feelings of bloating in the abdomen. Passage of more gas than usual.  Walking can help get rid of the air that was put into your GI tract during the procedure and reduce the bloating. If you had a lower endoscopy (such as a colonoscopy or flexible sigmoidoscopy) you may notice spotting of blood in your stool or on the toilet paper. If you underwent a bowel prep for your procedure, you may not have a normal bowel movement for a few days.  Please Note:  You might notice some irritation and congestion in your nose or some drainage.  This is from the oxygen used during your procedure.  There is no need for concern and it should clear up in a day or so.  SYMPTOMS TO REPORT IMMEDIATELY:   Following lower endoscopy (colonoscopy or flexible sigmoidoscopy):  Excessive amounts of blood in the stool  Significant tenderness or worsening of abdominal pains  Swelling of the abdomen that is new, acute  Fever of 100F or higher   For urgent or emergent issues, a gastroenterologist can be reached at any hour by calling [phone].   DIET:  We do recommend a small meal at first, but then you may proceed to your regular diet.  Drink plenty of fluids but you should avoid alcoholic beverages for 24 hours.  ACTIVITY:  You should plan to take it easy for the rest of today and you should NOT DRIVE or use heavy machinery until tomorrow (because of the sedation medicines used during the test).     FOLLOW UP: Our staff will call the number listed on your records the next business day following your procedure to check on you and address any questions or concerns that you may have regarding the information given to you following your procedure. If we do not reach you, we will leave a message.  However, if you are feeling well and you are not experiencing any problems, there is no need to return our call.  We will assume that you have returned to your regular daily activities without incident.  If any biopsies were taken you will be contacted by phone or by letter within the next 1-3 weeks.  Please call us at [phone] if you have not heard about the biopsies in 3 weeks.    SIGNATURES/CONFIDENTIALITY: You and/or your care partner have signed paperwork which will be entered into your electronic medical record.  These signatures attest to the fact that that the information above on your After Visit Summary has been reviewed and is understood.  Full responsibility of the confidentiality of this discharge information lies with you and/or your care-partner.    Handout was given to your care partner on polyps. You may resume your current medications today. Await biopsy results. CT of abdomen/pelvis to be scheduled.  Dr. Corena Pilgrim nurse from the office will call you with an appointment and instructions. Please call if any questions or concerns.

## 2017-03-12 NOTE — Progress Notes (Signed)
Pt's states no medical or surgical changes since previsit or office visit. 

## 2017-03-13 ENCOUNTER — Other Ambulatory Visit: Payer: Self-pay

## 2017-03-13 ENCOUNTER — Telehealth: Payer: Self-pay

## 2017-03-13 DIAGNOSIS — R1011 Right upper quadrant pain: Secondary | ICD-10-CM

## 2017-03-13 NOTE — Telephone Encounter (Signed)
  Follow up Call-  Call back number 03/12/2017  Post procedure Call Back phone  # [phone]  Permission to leave phone message Yes  Some recent data might be hidden     Patient questions:  Do you have a fever, pain , or abdominal swelling? No. Pain Score  0 *  Have you tolerated food without any problems? Yes.    Have you been able to return to your normal activities? Yes.    Do you have any questions about your discharge instructions: Diet   No. Medications  No. Follow up visit  No.  Do you have questions or concerns about your Care? No.  Actions: * If pain score is 4 or above: No action needed, pain <4.  No complaints noted per pt. maw

## 2017-03-13 NOTE — Telephone Encounter (Signed)
CT scheduled for 03-20-2017 @ 3:30pm. Pt has been notified and aware.

## 2017-03-18 ENCOUNTER — Encounter (INDEPENDENT_AMBULATORY_CARE_PROVIDER_SITE_OTHER): Payer: Self-pay

## 2017-03-18 ENCOUNTER — Ambulatory Visit (INDEPENDENT_AMBULATORY_CARE_PROVIDER_SITE_OTHER)
Admission: RE | Admit: 2017-03-18 | Discharge: 2017-03-18 | Disposition: A | Discharge status: Home / Self Care | Payer: 59 | Source: Ambulatory Visit | Attending: Gastroenterology | Admitting: Gastroenterology

## 2017-03-18 DIAGNOSIS — R1011 Right upper quadrant pain: Secondary | ICD-10-CM | POA: Diagnosis not present

## 2017-03-18 DIAGNOSIS — K7689 Other specified diseases of liver: Secondary | ICD-10-CM | POA: Diagnosis not present

## 2017-03-18 MED ORDER — IOPAMIDOL (ISOVUE-300) INJECTION 61%
100.0000 mL | Freq: Once | INTRAVENOUS | Status: AC | PRN
  Administered 2017-03-18: 100 mL via INTRAVENOUS

## 2017-03-20 ENCOUNTER — Other Ambulatory Visit: Payer: 59

## 2017-03-20 ENCOUNTER — Encounter: Payer: Self-pay | Admitting: Gastroenterology

## 2017-04-29 DIAGNOSIS — J209 Acute bronchitis, unspecified: Secondary | ICD-10-CM | POA: Diagnosis not present

## 2017-05-09 ENCOUNTER — Ambulatory Visit: Payer: 59 | Admitting: Gastroenterology

## 2017-05-09 ENCOUNTER — Encounter: Payer: Self-pay | Admitting: Gastroenterology

## 2017-05-09 VITALS — BP 104/60 | HR 92 | Ht 70.0 in | Wt 208.0 lb

## 2017-05-09 DIAGNOSIS — K5900 Constipation, unspecified: Secondary | ICD-10-CM

## 2017-05-09 DIAGNOSIS — R140 Abdominal distension (gaseous): Secondary | ICD-10-CM

## 2017-05-09 NOTE — Progress Notes (Signed)
     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  His heartburn is now resolved taking apple cider vinegar once or twice a day.  He has cut out soda and some other foods and managed to lose about 10 pounds over the last couple of months.  He is trying to get more physically active, but it has been difficult due to recent sinus infections.  Overall, his symptoms are considerably improved with more regular bowel habits on MiraLAX and less abdominal bloating.  He occasionally has some fleeting left upper quadrant pain if he has not had a bowel movement that day.  There is no rectal bleeding, his appetite is good and weight stable. Colonoscopy was normal except for a 4 mm tubular adenoma polyp.  ROS: Cardiovascular:  no chest pain Respiratory: no dyspnea  The patient's Past Medical, Family and Social History were reviewed and are on file in the EMR.  Objective:  Med list reviewed  Current Outpatient Medications:  .  polyethylene glycol powder (GLYCOLAX/MIRALAX) powder, Take 17 g by mouth daily., Disp: , Rfl:   Current Facility-Administered Medications:  .  0.9 %  sodium chloride infusion, 500 mL, Intravenous, Continuous, Danis, Estill Cotta III, MD   Vital signs in last 24 hrs: Vitals:   05/09/17 1521  BP: 104/60  Pulse: 92    Physical Exam    HEENT: sclera anicteric, oral mucosa moist without lesions  Neck: supple, no thyromegaly, JVD or lymphadenopathy  Cardiac: RRR without murmurs, S1S2 heard, no peripheral edema  Pulm: clear to auscultation bilaterally, normal RR and effort noted  Abdomen: soft, no tenderness, with active bowel sounds. No guarding or palpable hepatosplenomegaly.  Skin; warm and dry, no jaundice or rash   Radiologic studies:  CTAP - hepatic cysts, o/w normal  study  @ASSESSMENTPLANBEGIN @ Assessment: Encounter Diagnoses  Name Primary?  . Constipation, unspecified constipation type Yes  . Abdominal bloating     He is much improved overall, and is reassured by his workup thus far.  We also discussed some dietary measures, he will continue his current regimen and see me as needed.   Total time 15 minutes, over half spent in counseling and coordination of care.   Nelida Meuse III

## 2017-05-09 NOTE — Patient Instructions (Addendum)
If you are age 44 or older, your body mass index should be between 23-30. Your Body mass index is 29.84 kg/m. If this is out of the aforementioned range listed, please consider follow up with your Primary Care Provider.  If you are age 21 or younger, your body mass index should be between 19-25. Your Body mass index is 29.84 kg/m. If this is out of the aformentioned range listed, please consider follow up with your Primary Care Provider.     Food Guidelines for a sensitive stomach  Many people have difficulty digesting certain foods, causing a variety of distressing and embarrassing symptoms such as abdominal pain, bloating and gas.  These foods may need to be avoided or consumed in small amounts.  Here are some tips that might be helpful for you.  1.   Lactose intolerance is the difficulty or complete inability to digest lactose, the natural sugar in milk and anything made from milk.  This condition is harmless, common, and can begin any time during life.  Some people can digest a modest amount of lactose while others cannot tolerate any.  Also, not all dairy products contain equal amounts of lactose.  For example, hard cheeses such as parmesan have less lactose than soft cheeses such as cheddar.  Yogurt has less lactose than milk or cheese.  Many packaged foods (even many brands of bread) have milk, so read ingredient lists carefully.  It is difficult to test for lactose intolerance, so just try avoiding lactose as much as possible for a week and see what happens with your symptoms.  If you seem to be lactose intolerant, the best plan is to avoid it (but make sure you get calcium from another source).  The next best thing is to use lactase enzyme supplements, available over the counter everywhere.  Just know that many lactose intolerant people need to take several tablets with each serving of dairy to avoid symptoms.  Lastly, a lot of restaurant food is made with milk or butter.  Many are things you  might not suspect, such as mashed potatoes, rice and pasta (cooked with butter) and "grilled" items.  If you are lactose intolerant, it never hurts to ask your server what has milk or butter.  2.   Fiber is an important part of your diet, but not all fiber is well-tolerated.  Insoluble fiber such as bran is often consumed by normal gut bacteria and converted into gas.  Soluble fiber such as oats, squash, carrots and green beans are typically tolerated better.  3.   Some types of carbohydrates can be poorly digested.  Examples include: fructose (apples, cherries, pears, raisins and other dried fruits), fructans (onions, zucchini, large amounts of wheat), sorbitol/mannitol/xylitol and sucralose/Splenda (common artificial sweeteners), and raffinose (lentils, broccoli, cabbage, asparagus, brussel sprouts, many types of beans).  Do a Development worker, community for [employer] and you will find helpful information. Beano, a dietary supplement, will often help with raffinose-containing foods.  As with lactase tablets, you may need several per serving.  4.   Whenever possible, avoid processed food&meats and chemical additives.  High fructose corn syrup, a common sweetener, may be difficult to digest.  Eggs and soy (comes from the soybean, and added to many foods now) are the other most common bloating/gassy foods.  - Dr. [facility]

## 2017-07-14 ENCOUNTER — Ambulatory Visit (INDEPENDENT_AMBULATORY_CARE_PROVIDER_SITE_OTHER): Payer: 59 | Admitting: Internal Medicine

## 2017-07-14 ENCOUNTER — Encounter: Payer: Self-pay | Admitting: Internal Medicine

## 2017-07-14 VITALS — BP 118/68 | HR 80 | Temp 97.7°F | Resp 14 | Ht 70.0 in | Wt 208.5 lb

## 2017-07-14 DIAGNOSIS — Z0000 Encounter for general adult medical examination without abnormal findings: Secondary | ICD-10-CM

## 2017-07-14 LAB — VITAMIN B12: Vitamin B-12: 217 pg/mL (ref 211–911)

## 2017-07-14 NOTE — Progress Notes (Signed)
Subjective:    Patient ID: Jose [name], male    DOB: 1970/06/29, 47 y.o.   MRN: [id number]  DOS:  07/14/2017 Type of visit - description : CPX Interval history: In general feeling well.   Review of Systems GI symptoms much decreased, still has occasional upper abdominal discomfort.  GERD symptoms controlled with OTC vinegar Chronic paresthesias on and off, at baseline  Other than above, a 14 point review of systems is negative    Past Medical History:  Diagnosis Date  . Dysphagia    EGD : occult stricture , Bx slt increaed eosinophiles  . Morton's neuroma   . RAD (reactive airway disease)    ??  . Recurrent sinusitis     Past Surgical History:  Procedure Laterality Date  . ESOPHAGOGASTRODUODENOSCOPY (EGD) WITH ESOPHAGEAL DILATION      Social History   Socioeconomic History  . Marital status: Married    Spouse name: Not on file  . Number of children: 2  . Years of education: Not on file  . Highest education level: Not on file  Social Needs  . Financial resource strain: Not on file  . Food insecurity - worry: Not on file  . Food insecurity - inability: Not on file  . Transportation needs - medical: Not on file  . Transportation needs - non-medical: Not on file  Occupational History  . 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 Smoking status: Never Smoker  . Smokeless tobacco: Never Used  Substance and Sexual Activity  . Alcohol use: No    Alcohol/week: 0.0 oz    Comment: rare   . Drug use: No  . Sexual activity: Not on file  Other Topics Concern  . 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  73 and 35 y/o   Lives with wife and children in a 2 story home.           Family History  Problem Relation Age of Onset  . Arthritis Mother        Living  . Healthy Mother        Living  . Asthma Son   . Thyroid disease Sister   . Colon cancer Neg Hx   . Prostate cancer Neg Hx   . CAD  Neg Hx   . Diabetes Neg Hx      Allergies as of 07/14/2017      Reactions   Nexium [esomeprazole Magnesium] Nausea Only      Medication List        Accurate as of 07/14/17  9:40 PM. Always use your most recent med list.          polyethylene glycol powder powder Commonly known as:  GLYCOLAX/MIRALAX Take 17 g by mouth daily.          Objective:   Physical Exam BP 118/68 (BP Location: Right Arm, Patient Position: Sitting, Cuff Size: Normal)   Pulse 80   Temp 97.7 F (36.5 C) (Oral)   Resp 14   Ht 5\' 10"  (1.778 m)   Wt 208 lb 8 oz (94.6 kg)   SpO2 98%   BMI 29.92 kg/m  General:   Well developed, well nourished . NAD.  Neck: No  thyromegaly  HEENT:  Normocephalic . Face symmetric, atraumatic Lungs:  CTA B Normal respiratory effort, no intercostal retractions, no accessory muscle use. Heart: RRR,  no murmur.  No pretibial edema bilaterally  Abdomen:  Not distended, soft, non-tender. No rebound or rigidity.   Skin: Exposed areas without rash. Not pale. Not jaundice Neurologic:  alert & oriented X3.  Speech normal, gait appropriate for age and unassisted Strength symmetric and appropriate for age.  Psych: Cognition and judgment appear intact.  Cooperative with normal attention span and concentration.  Behavior appropriate. No anxious or depressed appearing.     Assessment & Plan:   Assessment Recurrent sinusitis ? Reactive airway disease GI: -Dysphagia: EGD x 2  >>> occult stricture, s/p stretching , BX increased eosinophils -Chronic constipation,  Cscope 02-2017, + polyps -Dyspepsia: abd [place] 02-2017: Contracted bladder without stones, saw GI, no surgical referral, CT abdomen 02/2017: Nonacute Paresthesias: onset 2014, chronic, feet > rest of the body, saw  neurology 2015, 2016 (Dr Posey Pronto). EMG 03-29-14 neg, MRI of the back in 09/2013 normal.  Intolerant to Neurontin, B6 was a slightly low, sxs decreased with OTC B12 supplements; slt better w/ Morton's  neuroma local injection (2016)  PLAN: Multiple GI symptoms including constipation: Since the last visit, saw GI, they did not recommend further surgical evaluation for the contracted gallbladder seen in abdominal US, s/p colonoscopy:  one polyp, CT of the abdomen negative.  He is doing better or MiraLAX.  GERD symptoms controlled with vinegar. Paresthesias: On and off, unchanged. RTC 1 year

## 2017-07-14 NOTE — Assessment & Plan Note (Signed)
Multiple GI symptoms including constipation: Since the last visit, saw GI, they did not recommend further surgical evaluation for the contracted gallbladder seen in abdominal US, s/p colonoscopy:  one polyp, CT of the abdomen negative.  He is doing better or MiraLAX.  GERD symptoms controlled with vinegar. Paresthesias: On and off, unchanged. RTC 1 year

## 2017-07-14 NOTE — Assessment & Plan Note (Signed)
-   Tdap 2016, had a flu shot  - CCS: Cscope (done for constipation)02-2017, 1 polyp -Prostate ca screening: not indicated - Labs reviewed, all okay over the last year, will simply check a B12 and B6 with history of low B6.   - diet, exercise discussed.

## 2017-07-14 NOTE — Patient Instructions (Signed)
GO TO THE LAB : Get the blood work     GO TO THE FRONT DESK Schedule your next appointment for a  Physical exam in 1 year 

## 2017-07-14 NOTE — Progress Notes (Signed)
Pre visit review using our clinic review tool, if applicable. No additional management support is needed unless otherwise documented below in the visit note. 

## 2017-07-17 LAB — VITAMIN B6: Vitamin B6: 13.9 ng/mL (ref 2.1–21.7)

## 2017-11-11 DIAGNOSIS — D225 Melanocytic nevi of trunk: Secondary | ICD-10-CM | POA: Diagnosis not present

## 2017-11-11 DIAGNOSIS — D1801 Hemangioma of skin and subcutaneous tissue: Secondary | ICD-10-CM | POA: Diagnosis not present

## 2017-11-11 DIAGNOSIS — L814 Other melanin hyperpigmentation: Secondary | ICD-10-CM | POA: Diagnosis not present

## 2018-01-25 IMAGING — US US ABDOMEN COMPLETE
1 series · 14 of 25 positions shown · non-contrast
Comparison: None.

CLINICAL DATA: Right upper quadrant pain

EXAM:
ABDOMEN ULTRASOUND COMPLETE

[Series 1: us abdomen complete · 0.32mm/px · 14 of 117 slices shown]
[im 1/117]
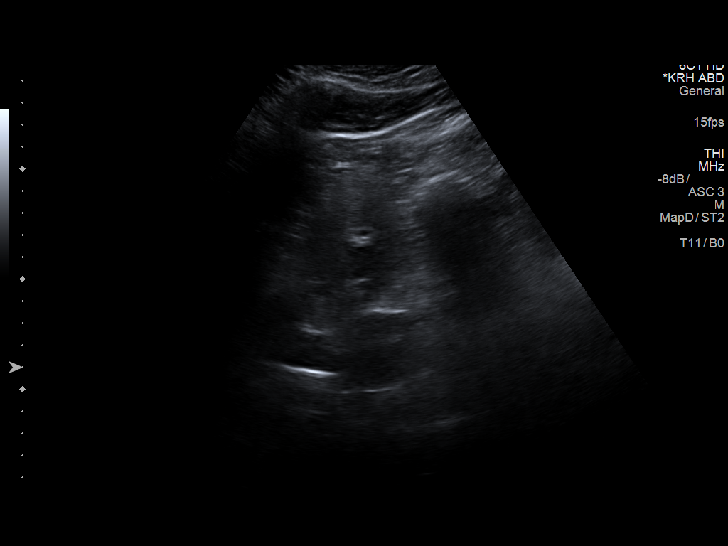
[im 10/117]
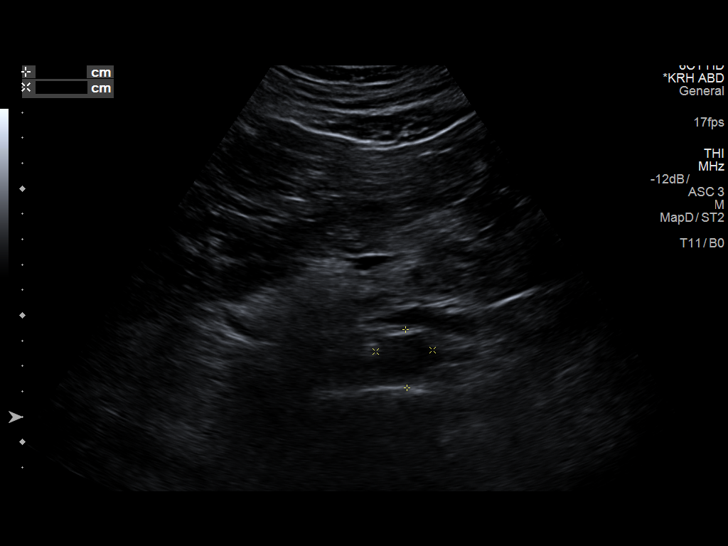
[im 20/117]
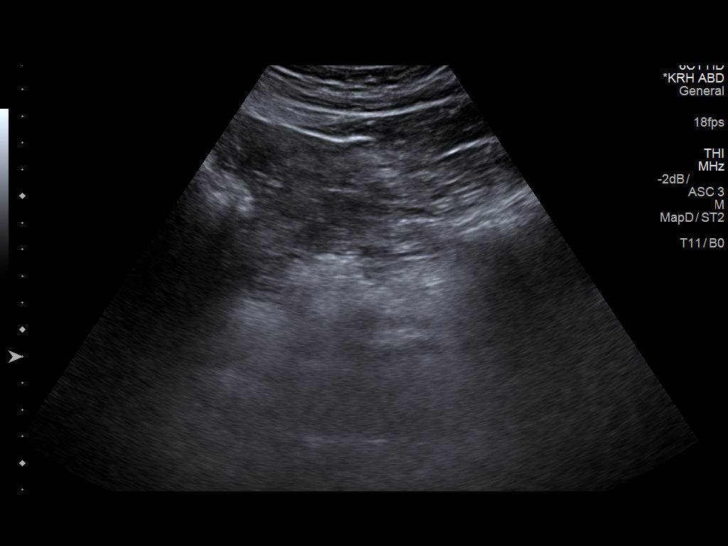
[im 30/117]
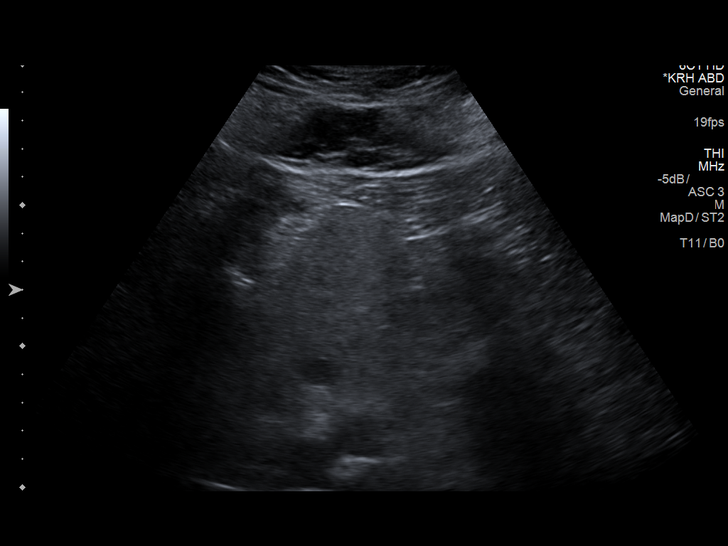
[im 39/117]
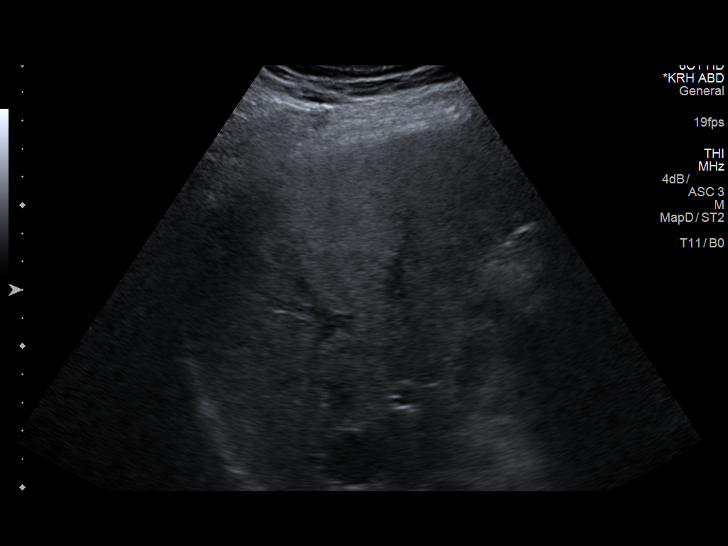
[im 44/117]
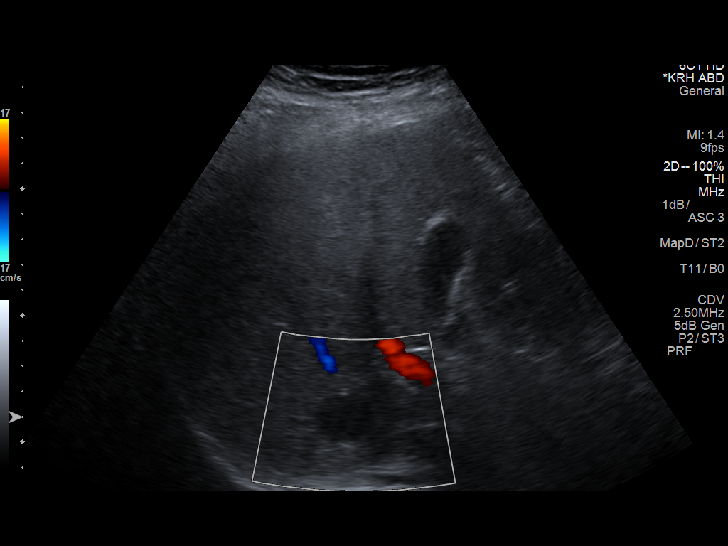
[im 54/117]
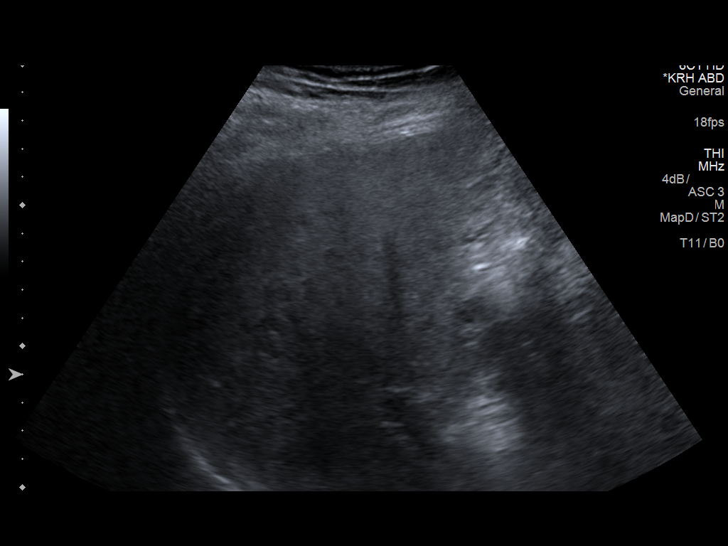
[im 63/117]
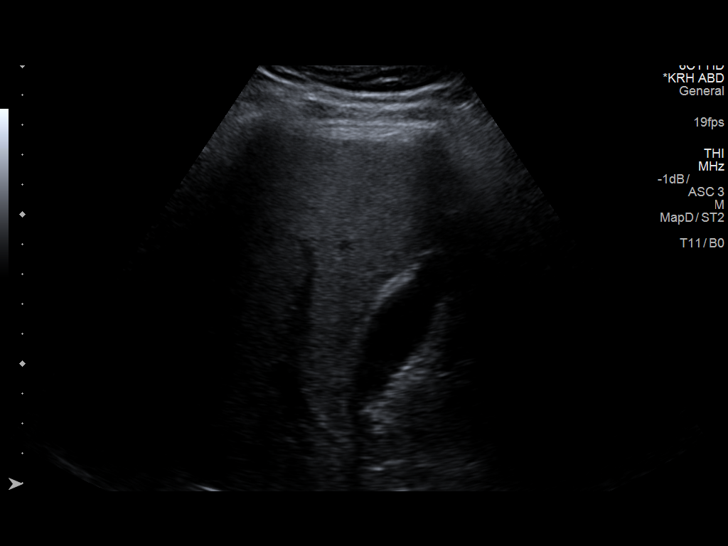
[im 73/117]
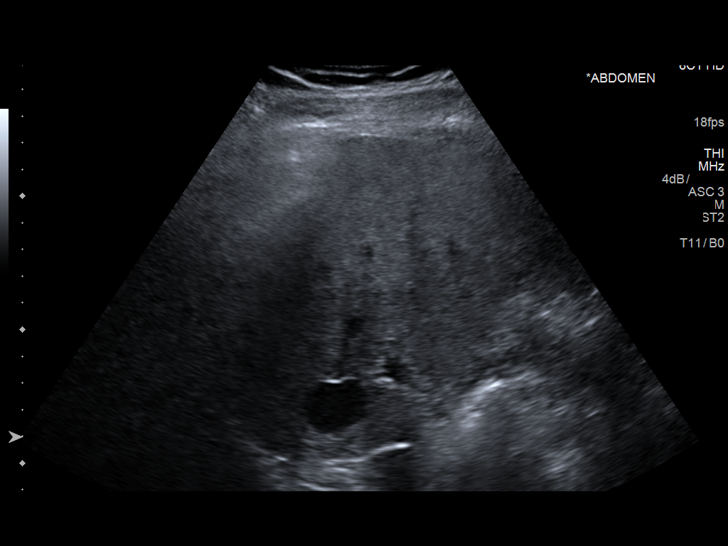
[im 78/117]
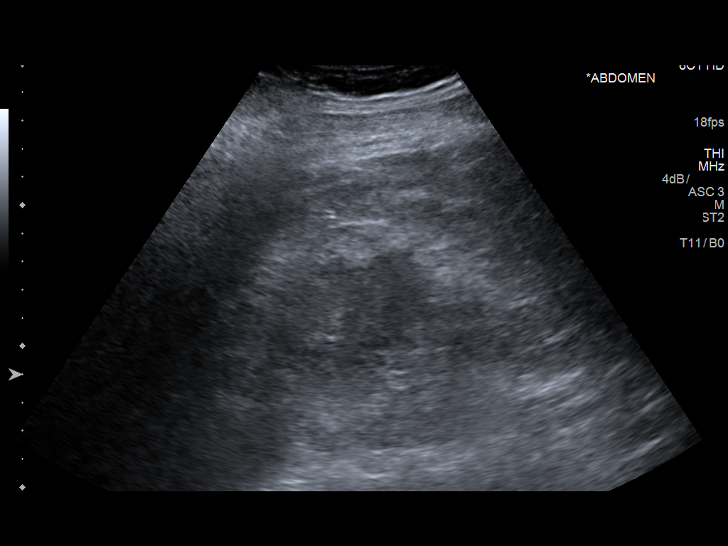
[im 88/117]
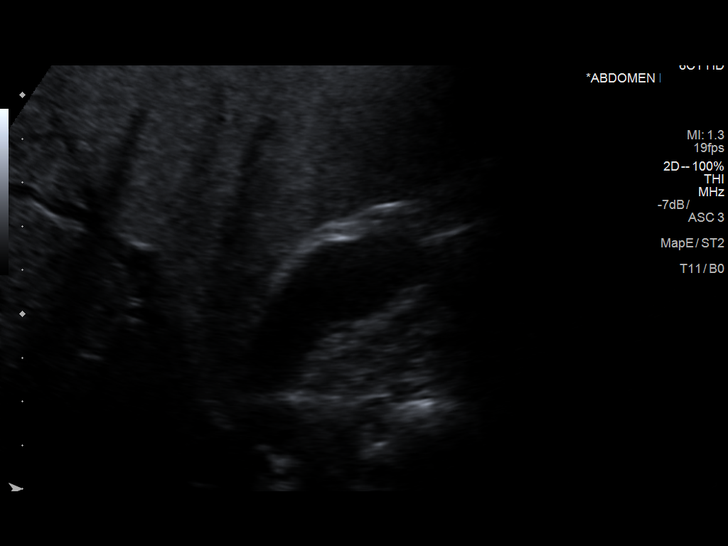
[im 97/117]
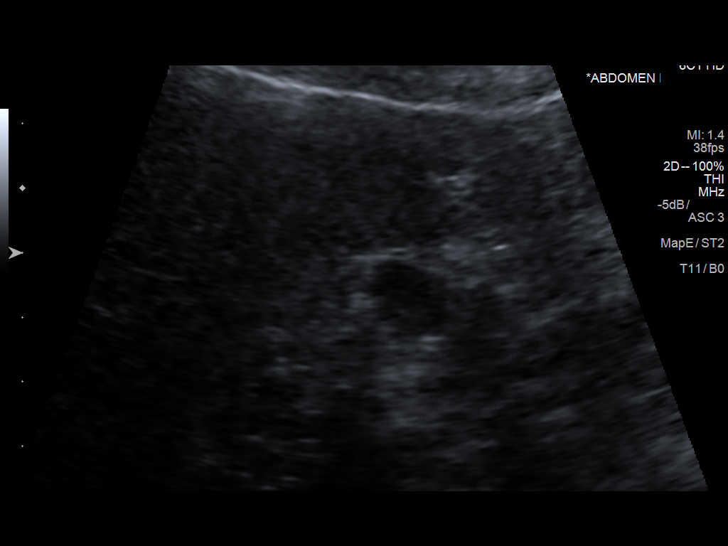
[im 107/117]
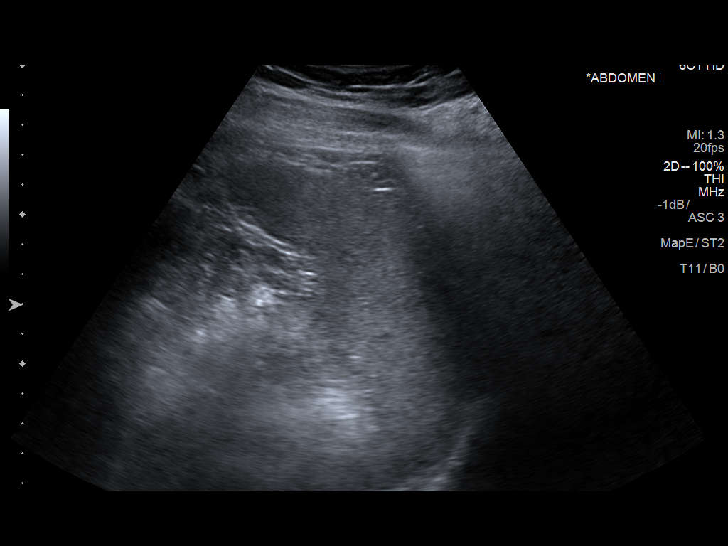
[im 117/117]
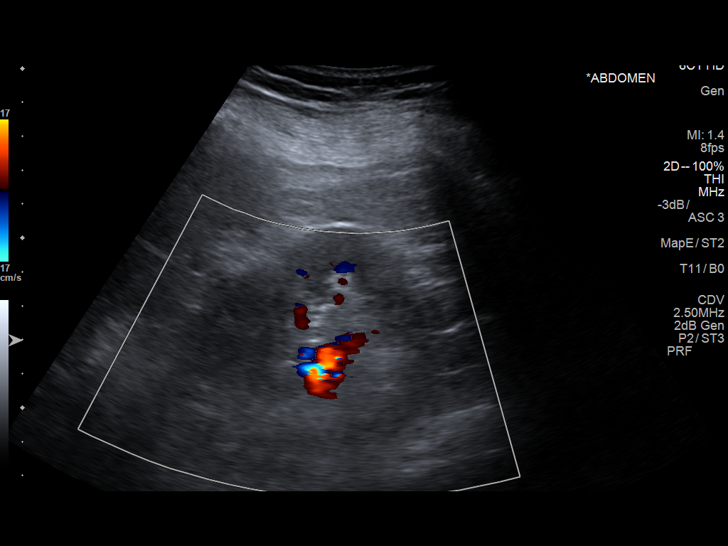

[14 of 25 positions shown; findings below may reference images not displayed]

FINDINGS: Gallbladder: No gallstones or wall thickening visualized. No
sonographic Murphy sign noted by sonographer. Possible small volume
pericholecystic fluid.

Common bile duct: Diameter: 4 mm, normal

Liver: There are multiple hepatic cysts. There is mildly increased
hepatic parenchymal echogenicity. Portal vein is patent on color
Doppler imaging with normal direction of blood flow towards the
liver.

IVC: No abnormality visualized.

Pancreas: Visualized portion unremarkable.

Spleen: Size and appearance within normal limits.

Right Kidney: Length: 11.9 cm. Echogenicity within normal limits. No
mass or hydronephrosis visualized.

Left Kidney: Length: 12.3 cm. Echogenicity within normal limits. No
mass or hydronephrosis visualized.

Abdominal aorta: No aneurysm visualized.

Other findings: None.
IMPRESSION: 1. Mildly contracted gallbladder with possible small amount of
pericholecystic fluid. No other evidence of acute cholecystitis.
2. Mild hepatic hyperechogenicity may indicate hepatic steatosis.

## 2018-02-18 DIAGNOSIS — M25561 Pain in right knee: Secondary | ICD-10-CM | POA: Diagnosis not present

## 2018-03-05 DIAGNOSIS — M25561 Pain in right knee: Secondary | ICD-10-CM | POA: Diagnosis not present

## 2018-03-05 DIAGNOSIS — M2351 Chronic instability of knee, right knee: Secondary | ICD-10-CM | POA: Diagnosis not present

## 2018-03-09 ENCOUNTER — Other Ambulatory Visit: Payer: Self-pay | Admitting: Orthopedic Surgery

## 2018-03-09 DIAGNOSIS — M25561 Pain in right knee: Secondary | ICD-10-CM

## 2018-03-12 ENCOUNTER — Ambulatory Visit
Admission: RE | Admit: 2018-03-12 | Discharge: 2018-03-12 | Disposition: A | Discharge status: Home / Self Care | Payer: 59 | Source: Ambulatory Visit | Attending: Orthopedic Surgery | Admitting: Orthopedic Surgery

## 2018-03-12 DIAGNOSIS — M23321 Other meniscus derangements, posterior horn of medial meniscus, right knee: Secondary | ICD-10-CM | POA: Diagnosis not present

## 2018-03-12 DIAGNOSIS — M25561 Pain in right knee: Secondary | ICD-10-CM

## 2018-03-13 DIAGNOSIS — S83241A Other tear of medial meniscus, current injury, right knee, initial encounter: Secondary | ICD-10-CM | POA: Diagnosis not present

## 2018-03-27 HISTORY — PX: KNEE ARTHROSCOPY: SUR90

## 2018-03-30 DIAGNOSIS — M5032 Other cervical disc degeneration, mid-cervical region, unspecified level: Secondary | ICD-10-CM | POA: Diagnosis not present

## 2018-03-30 DIAGNOSIS — M9901 Segmental and somatic dysfunction of cervical region: Secondary | ICD-10-CM | POA: Diagnosis not present

## 2018-03-30 DIAGNOSIS — M9903 Segmental and somatic dysfunction of lumbar region: Secondary | ICD-10-CM | POA: Diagnosis not present

## 2018-03-31 DIAGNOSIS — M5032 Other cervical disc degeneration, mid-cervical region, unspecified level: Secondary | ICD-10-CM | POA: Diagnosis not present

## 2018-03-31 DIAGNOSIS — M9901 Segmental and somatic dysfunction of cervical region: Secondary | ICD-10-CM | POA: Diagnosis not present

## 2018-03-31 DIAGNOSIS — M9903 Segmental and somatic dysfunction of lumbar region: Secondary | ICD-10-CM | POA: Diagnosis not present

## 2018-04-06 DIAGNOSIS — M5032 Other cervical disc degeneration, mid-cervical region, unspecified level: Secondary | ICD-10-CM | POA: Diagnosis not present

## 2018-04-06 DIAGNOSIS — M9903 Segmental and somatic dysfunction of lumbar region: Secondary | ICD-10-CM | POA: Diagnosis not present

## 2018-04-06 DIAGNOSIS — M9901 Segmental and somatic dysfunction of cervical region: Secondary | ICD-10-CM | POA: Diagnosis not present

## 2018-04-07 DIAGNOSIS — M5032 Other cervical disc degeneration, mid-cervical region, unspecified level: Secondary | ICD-10-CM | POA: Diagnosis not present

## 2018-04-07 DIAGNOSIS — M9903 Segmental and somatic dysfunction of lumbar region: Secondary | ICD-10-CM | POA: Diagnosis not present

## 2018-04-07 DIAGNOSIS — M9901 Segmental and somatic dysfunction of cervical region: Secondary | ICD-10-CM | POA: Diagnosis not present

## 2018-04-08 DIAGNOSIS — S83231A Complex tear of medial meniscus, current injury, right knee, initial encounter: Secondary | ICD-10-CM | POA: Diagnosis not present

## 2018-04-08 DIAGNOSIS — M6751 Plica syndrome, right knee: Secondary | ICD-10-CM | POA: Diagnosis not present

## 2018-04-08 DIAGNOSIS — M659 Synovitis and tenosynovitis, unspecified: Secondary | ICD-10-CM | POA: Diagnosis not present

## 2018-04-08 DIAGNOSIS — G8918 Other acute postprocedural pain: Secondary | ICD-10-CM | POA: Diagnosis not present

## 2018-04-14 DIAGNOSIS — M25661 Stiffness of right knee, not elsewhere classified: Secondary | ICD-10-CM | POA: Diagnosis not present

## 2018-04-14 DIAGNOSIS — Z9889 Other specified postprocedural states: Secondary | ICD-10-CM | POA: Diagnosis not present

## 2018-04-14 DIAGNOSIS — S83231A Complex tear of medial meniscus, current injury, right knee, initial encounter: Secondary | ICD-10-CM | POA: Diagnosis not present

## 2018-04-16 DIAGNOSIS — M9901 Segmental and somatic dysfunction of cervical region: Secondary | ICD-10-CM | POA: Diagnosis not present

## 2018-04-16 DIAGNOSIS — M5032 Other cervical disc degeneration, mid-cervical region, unspecified level: Secondary | ICD-10-CM | POA: Diagnosis not present

## 2018-04-16 DIAGNOSIS — M9903 Segmental and somatic dysfunction of lumbar region: Secondary | ICD-10-CM | POA: Diagnosis not present

## 2018-04-17 DIAGNOSIS — M9901 Segmental and somatic dysfunction of cervical region: Secondary | ICD-10-CM | POA: Diagnosis not present

## 2018-04-17 DIAGNOSIS — M5032 Other cervical disc degeneration, mid-cervical region, unspecified level: Secondary | ICD-10-CM | POA: Diagnosis not present

## 2018-04-17 DIAGNOSIS — M9903 Segmental and somatic dysfunction of lumbar region: Secondary | ICD-10-CM | POA: Diagnosis not present

## 2018-04-20 DIAGNOSIS — M9901 Segmental and somatic dysfunction of cervical region: Secondary | ICD-10-CM | POA: Diagnosis not present

## 2018-04-20 DIAGNOSIS — M9903 Segmental and somatic dysfunction of lumbar region: Secondary | ICD-10-CM | POA: Diagnosis not present

## 2018-04-20 DIAGNOSIS — M5032 Other cervical disc degeneration, mid-cervical region, unspecified level: Secondary | ICD-10-CM | POA: Diagnosis not present

## 2018-05-14 ENCOUNTER — Other Ambulatory Visit: Payer: Self-pay | Admitting: Orthopedic Surgery

## 2018-05-14 DIAGNOSIS — M7989 Other specified soft tissue disorders: Secondary | ICD-10-CM

## 2018-05-14 DIAGNOSIS — M79604 Pain in right leg: Secondary | ICD-10-CM

## 2018-05-18 ENCOUNTER — Ambulatory Visit
Admission: RE | Admit: 2018-05-18 | Discharge: 2018-05-18 | Disposition: A | Discharge status: Home / Self Care | Payer: 59 | Source: Ambulatory Visit | Attending: Orthopedic Surgery | Admitting: Orthopedic Surgery

## 2018-05-18 DIAGNOSIS — M79604 Pain in right leg: Secondary | ICD-10-CM

## 2018-05-18 DIAGNOSIS — R600 Localized edema: Secondary | ICD-10-CM | POA: Diagnosis not present

## 2018-05-18 DIAGNOSIS — M79661 Pain in right lower leg: Secondary | ICD-10-CM | POA: Diagnosis not present

## 2018-05-18 DIAGNOSIS — M7989 Other specified soft tissue disorders: Secondary | ICD-10-CM

## 2018-06-29 ENCOUNTER — Encounter: Payer: Self-pay | Admitting: Internal Medicine

## 2018-06-29 ENCOUNTER — Ambulatory Visit: Payer: 59 | Admitting: Internal Medicine

## 2018-06-29 VITALS — BP 114/80 | HR 87 | Temp 97.5°F | Resp 16 | Ht 70.0 in | Wt 218.4 lb

## 2018-06-29 DIAGNOSIS — H6982 Other specified disorders of Eustachian tube, left ear: Secondary | ICD-10-CM

## 2018-06-29 MED ORDER — AZELASTINE HCL 0.1 % NA SOLN: 2.0000 | Freq: Every evening | NASAL | 3 refills | Status: DC | PRN

## 2018-06-29 NOTE — Progress Notes (Signed)
Subjective:    Patient ID: Jose [name], male    DOB: 1970-08-17, 48 y.o.   MRN: [id number]  DOS:  06/29/2018 Type of visit - description: acute Symptoms started approximately 3 weeks ago with a feeling of stuffiness at the left ear, minimal on the right.  "Like I have cotton in it". Takes occasionally Sudafed and that seems to help.  No ear discharge  Review of Systems  Denies fever chills No recent URI or nasal discharge No cough Occasionally had a "sinus headache" located at the frontal maxillary areas but that is gradually better. Past Medical History:  Diagnosis Date  . Dysphagia    EGD : occult stricture , Bx slt increaed eosinophiles  . Morton's neuroma   . RAD (reactive airway disease)    ??  . Recurrent sinusitis     Past Surgical History:  Procedure Laterality Date  . ESOPHAGOGASTRODUODENOSCOPY (EGD) WITH ESOPHAGEAL DILATION    . KNEE ARTHROSCOPY Right 03/2018    Social History   Socioeconomic History  . Marital status: Married    Spouse name: Not on file  . Number of children: 2  . Years of education: Not on file  . Highest education level: Not on file  Occupational History  . 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 Financial resource strain: Not on file  . Food insecurity:    Worry: Not on file    Inability: Not on file  . Transportation needs:    Medical: Not on file    Non-medical: Not on file  Tobacco Use  . Smoking status: Never Smoker  . Smokeless tobacco: Never Used  Substance and Sexual Activity  . Alcohol use: No    Alcohol/week: 0.0 standard drinks    Comment: rare   . Drug use: No  . Sexual activity: Not on file  Lifestyle  . Physical activity:    Days per week: Not on file    Minutes per session: Not on file  . Stress: Not on file  Relationships  . Social connections:    Talks on phone: Not on file    Gets together: Not on file    Attends religious service: Not on file    Active member of club or organization:  Not on file    Attends meetings of clubs or organizations: Not on file    Relationship status: Not on file  . Intimate partner violence:    Fear of current or ex partner: Not on file    Emotionally abused: Not on file    Physically abused: Not on file    Forced sexual activity: Not on file  Other Topics Concern  . 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  52 and 75 y/o   Lives with wife and children in a 2 story home.            Allergies as of 06/29/2018      Reactions   Nexium [esomeprazole Magnesium] Nausea Only      Medication List       Accurate as of June 29, 2018  2:03 PM. Always use your most recent med list.        azelastine 0.1 % nasal spray Commonly known as:  ASTELIN Place 2 sprays into both nostrils at bedtime as needed for rhinitis. Use in each nostril as directed   polyethylene glycol powder powder Commonly known  asDesma Maxim Take 17 g by mouth daily.           Objective:   Physical Exam BP 114/80 (BP Location: Left Arm, Patient Position: Sitting, Cuff Size: Normal)   Pulse 87   Temp (!) 97.5 F (36.4 C) (Oral)   Resp 16   Ht 5\' 10"  (1.778 m)   Wt 218 lb 6 oz (99.1 kg)   SpO2 99%   BMI 31.33 kg/m  General:   Well developed, NAD, BMI noted. HEENT:  Normocephalic . Face symmetric, atraumatic. TMs: Minimally bulged, no red.  Canals normal.  Throat symmetric and not red.  Nose not congested.  Sinuses no TTP Lungs:  CTA B Normal respiratory effort, no intercostal retractions, no accessory muscle use. Heart: RRR,  no murmur.  No pretibial edema bilaterally  Skin: Not pale. Not jaundice Neurologic:  alert & oriented X3.  Speech normal, gait appropriate for age and unassisted Psych--  Cognition and judgment appear intact.  Cooperative with normal attention span and concentration.  Behavior appropriate. No anxious or depressed appearing.      Assessment      Assessment Recurrent sinusitis ? Reactive airway disease GI: -Dysphagia: EGD x 2  >>> occult stricture, s/p stretching , BX increased eosinophils -Chronic constipation,  Cscope 02-2017, + polyps -Dyspepsia: abd [place] 02-2017: Contracted bladder without stones, saw GI, no surgical referral, CT abdomen 02/2017: Nonacute Paresthesias: onset 2014, chronic, feet > rest of the body, saw  neurology 2015, 2016 (Dr Posey Pronto). EMG 03-29-14 neg, MRI of the back in 09/2013 normal.  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  Call if not gradually better. Patient wonders if it is okay to take Sudafed, yes is okay to take it on as-needed basis only.

## 2018-06-29 NOTE — Assessment & Plan Note (Signed)
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  Call if not gradually better. Patient wonders if it is okay to take Sudafed, yes is okay to take it on as-needed basis only.

## 2018-06-29 NOTE — Progress Notes (Signed)
Pre visit review using our clinic review tool, if applicable. No additional management support is needed unless otherwise documented below in the visit note. 

## 2018-06-29 NOTE — Patient Instructions (Signed)
  Use OTC Nasocort or Flonase : 2 nasal sprays on each side of the nose in the morning until you feel better Use ASTELIN a prescribed spray : 2 nasal sprays on each side of the nose at night until you feel better    Call if not gradually better

## 2018-07-15 ENCOUNTER — Ambulatory Visit (INDEPENDENT_AMBULATORY_CARE_PROVIDER_SITE_OTHER): Payer: 59 | Admitting: Internal Medicine

## 2018-07-15 ENCOUNTER — Encounter: Payer: Self-pay | Admitting: Internal Medicine

## 2018-07-15 VITALS — BP 126/68 | HR 78 | Temp 97.8°F | Resp 16 | Ht 70.0 in | Wt 216.4 lb

## 2018-07-15 DIAGNOSIS — E569 Vitamin deficiency, unspecified: Secondary | ICD-10-CM

## 2018-07-15 DIAGNOSIS — Z0000 Encounter for general adult medical examination without abnormal findings: Secondary | ICD-10-CM | POA: Diagnosis not present

## 2018-07-15 LAB — LIPID PANEL
Cholesterol: 181 mg/dL (ref 0–200)
HDL: 39.7 mg/dL
LDL Cholesterol: 105 mg/dL — ABNORMAL HIGH (ref 0–99)
NonHDL: 141.24
Total CHOL/HDL Ratio: 5
Triglycerides: 182 mg/dL — ABNORMAL HIGH (ref 0.0–149.0)
VLDL: 36.4 mg/dL (ref 0.0–40.0)

## 2018-07-15 LAB — CBC WITH DIFFERENTIAL/PLATELET
Basophils Absolute: 0.1 K/uL (ref 0.0–0.1)
Basophils Relative: 1 % (ref 0.0–3.0)
Eosinophils Absolute: 0.1 K/uL (ref 0.0–0.7)
Eosinophils Relative: 1.4 % (ref 0.0–5.0)
HCT: 43.4 % (ref 39.0–52.0)
Hemoglobin: 15.2 g/dL (ref 13.0–17.0)
Lymphocytes Relative: 22.5 % (ref 12.0–46.0)
Lymphs Abs: 1.6 K/uL (ref 0.7–4.0)
MCHC: 35 g/dL (ref 30.0–36.0)
MCV: 82.9 fl (ref 78.0–100.0)
Monocytes Absolute: 0.7 K/uL (ref 0.1–1.0)
Monocytes Relative: 9.7 % (ref 3.0–12.0)
Neutro Abs: 4.5 K/uL (ref 1.4–7.7)
Neutrophils Relative %: 65.4 % (ref 43.0–77.0)
Platelets: 338 K/uL (ref 150.0–400.0)
RBC: 5.24 Mil/uL (ref 4.22–5.81)
RDW: 13.4 % (ref 11.5–15.5)
WBC: 6.9 K/uL (ref 4.0–10.5)

## 2018-07-15 LAB — COMPREHENSIVE METABOLIC PANEL WITH GFR
ALT: 26 U/L (ref 0–53)
AST: 18 U/L (ref 0–37)
Albumin: 4.5 g/dL (ref 3.5–5.2)
Alkaline Phosphatase: 92 U/L (ref 39–117)
BUN: 11 mg/dL (ref 6–23)
CO2: 26 meq/L (ref 19–32)
Calcium: 9.5 mg/dL (ref 8.4–10.5)
Chloride: 103 meq/L (ref 96–112)
Creatinine, Ser: 0.77 mg/dL (ref 0.40–1.50)
GFR: 109 mL/min
Glucose, Bld: 79 mg/dL (ref 70–99)
Potassium: 4.4 meq/L (ref 3.5–5.1)
Sodium: 138 meq/L (ref 135–145)
Total Bilirubin: 0.6 mg/dL (ref 0.2–1.2)
Total Protein: 7.1 g/dL (ref 6.0–8.3)

## 2018-07-15 LAB — TSH: TSH: 1.48 u[IU]/mL (ref 0.35–4.50)

## 2018-07-15 LAB — B12 AND FOLATE PANEL
Folate: 18.9 ng/mL (ref >5.9)
Vitamin B-12: 447 pg/mL (ref 211–911)

## 2018-07-15 NOTE — Assessment & Plan Note (Signed)
ET dysfunction: Improved, continue Flonase and Astelin Dysphagia: No issues recently Dyspepsia: Occasional heartburn but not a major problem. Chronic constipation, wonders if MiraLAX has long-term side effects, I do not think that is the case, nevertheless increasing the fiber intake or taking Metamucil should decrease the need to take MiraLAX regularly. Occasionally upper back pain: Better with chiropractor treatment, discussed regular stretching. RTC 1 year

## 2018-07-15 NOTE — Patient Instructions (Signed)
GO TO THE LAB : Get the blood work     GO TO THE FRONT DESK Schedule your next appointment  For a physical in 1 year, fasting

## 2018-07-15 NOTE — Assessment & Plan Note (Addendum)
-   Tdap 2016,  flu shot @ work ~02/2018 - CCS: Cscope (done for constipation)02-2017, 1 polyp -Prostate ca screening: not indicated - Labs: CMP, FLP, CBC, TSH, B12, B6 (history of B6 deficiency) -Diet and exercise discussed

## 2018-07-15 NOTE — Progress Notes (Signed)
Subjective:    Patient ID: Jose [name], male    DOB: 12/27/1970, 48 y.o.   MRN: [id number]  DOS:  07/15/2018 Type of visit - description: cpx Here for CPX Since the last office visit, ET dysfunction improved. Has chronic constipation, on MiraLAX approximately 5 times a week, it works. Denies dysphasia, occasional heartburn.   Review of Systems  Other than above, a 14 point review of systems is negative     Past Medical History:  Diagnosis Date  . Dysphagia    EGD : occult stricture , Bx slt increaed eosinophiles  . Morton's neuroma   . RAD (reactive airway disease)    ??  . Recurrent sinusitis     Past Surgical History:  Procedure Laterality Date  . ESOPHAGOGASTRODUODENOSCOPY (EGD) WITH ESOPHAGEAL DILATION    . KNEE ARTHROSCOPY Right 03/2018    Social History   Socioeconomic History  . Marital status: Married    Spouse name: Not on file  . Number of children: 2  . Years of education: Not on file  . Highest education level: Not on file  Occupational History  . 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 Financial resource strain: Not on file  . Food insecurity:    Worry: Not on file    Inability: Not on file  . Transportation needs:    Medical: Not on file    Non-medical: Not on file  Tobacco Use  . Smoking status: Never Smoker  . Smokeless tobacco: Never Used  Substance and Sexual Activity  . Alcohol use: No    Alcohol/week: 0.0 standard drinks    Comment: rare   . Drug use: No  . Sexual activity: Not on file  Lifestyle  . Physical activity:    Days per week: Not on file    Minutes per session: Not on file  . Stress: Not on file  Relationships  . Social connections:    Talks on phone: Not on file    Gets together: Not on file    Attends religious service: Not on file    Active member of club or organization: Not on file    Attends meetings of clubs or organizations: Not on file    Relationship status: Not on file  . Intimate  partner violence:    Fear of current or ex partner: Not on file    Emotionally abused: Not on file    Physically abused: Not on file    Forced sexual activity: Not on file  Other Topics Concern  . 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  34 and  9 y/o   Lives with wife and two children in a 2 story home.           Family History  Problem Relation Age of Onset  . Arthritis Mother        Living  . Healthy Mother        Living  . Asthma Son   . Thyroid disease Sister   . Colon cancer Neg Hx   . Prostate cancer Neg Hx   . CAD Neg Hx   . Diabetes Neg Hx      Allergies as of 07/15/2018      Reactions   Nexium [esomeprazole Magnesium] Nausea Only      Medication List       Accurate as of July 15, 2018  5:15 PM. Always use your most recent med list.        azelastine 0.1 % nasal spray Commonly known as:  ASTELIN Place 2 sprays into both nostrils at bedtime as needed for rhinitis. Use in each nostril as directed   polyethylene glycol powder powder Commonly known as:  GLYCOLAX/MIRALAX Take 17 g by mouth daily.           Objective:   Physical Exam BP 126/68 (BP Location: Left Arm, Patient Position: Sitting, Cuff Size: Normal)   Pulse 78   Temp 97.8 F (36.6 C) (Oral)   Resp 16   Ht 5\' 10"  (1.778 m)   Wt 216 lb 6 oz (98.1 kg)   SpO2 98%   BMI 31.05 kg/m  General: Well developed, NAD, BMI noted Neck: No  thyromegaly  HEENT:  Normocephalic . Face symmetric, atraumatic Lungs:  CTA B Normal respiratory effort, no intercostal retractions, no accessory muscle use. Heart: RRR,  no murmur.  No pretibial edema bilaterally  Abdomen:  Not distended, soft, non-tender. No rebound or rigidity.   Skin: Exposed areas without rash. Not pale. Not jaundice Neurologic:  alert & oriented X3.  Speech normal, gait appropriate for age and unassisted Strength symmetric and appropriate for age.   Psych: Cognition and judgment appear intact.  Cooperative with normal attention span and concentration.  Behavior appropriate. No anxious or depressed appearing.     Assessment     Assessment Recurrent sinusitis ? Reactive airway disease GI: -Dysphagia: EGD x 2  >>> occult stricture, s/p stretching , BX increased eosinophils -Chronic constipation,  Cscope 02-2017, + polyps -Dyspepsia: abd [place] 02-2017: Contracted bladder without stones, saw GI, no surgical referral, CT abdomen 02/2017: Nonacute Paresthesias: onset 2014, chronic, feet > rest of the body, saw  neurology 2015, 2016 (Dr Posey Pronto). EMG 03-29-14 neg, MRI of the back in 09/2013 normal.  Intolerant to Neurontin, B6 was a slightly low, sxs decreased with OTC B12 supplements; slt better w/ Morton's neuroma local injection (2016)  PLAN: ET dysfunction: Improved, continue Flonase and Astelin Dysphagia: No issues recently Dyspepsia: Occasional heartburn but not a major problem. Chronic constipation, wonders if MiraLAX has long-term side effects, I do not think that is the case, nevertheless increasing the fiber intake or taking Metamucil should decrease the need to take MiraLAX regularly. Occasionally upper back pain: Better with chiropractor treatment, discussed regular stretching. RTC 1 year

## 2018-07-15 NOTE — Progress Notes (Signed)
Pre visit review using our clinic review tool, if applicable. No additional management support is needed unless otherwise documented below in the visit note. 

## 2018-07-19 LAB — VITAMIN B6: Vitamin B6: 9 ng/mL (ref 2.1–21.7)

## 2019-03-09 ENCOUNTER — Telehealth: Payer: Self-pay | Admitting: Internal Medicine

## 2019-03-09 DIAGNOSIS — H6980 Other specified disorders of Eustachian tube, unspecified ear: Secondary | ICD-10-CM

## 2019-03-09 NOTE — Telephone Encounter (Signed)
ENT referral placed.

## 2019-03-09 NOTE — Telephone Encounter (Signed)
Copied from [place] [id number]. Topic: General - Other >> Mar 09, 2019 12:12 PM Keene Breath wrote: Reason for CRM: Patient called to request a referral to an ENT.  Please advise and CB# [phone]

## 2019-03-16 DIAGNOSIS — H6980 Other specified disorders of Eustachian tube, unspecified ear: Secondary | ICD-10-CM | POA: Insufficient documentation

## 2019-07-16 ENCOUNTER — Ambulatory Visit: Payer: 59 | Attending: Internal Medicine

## 2019-07-16 DIAGNOSIS — Z23 Encounter for immunization: Secondary | ICD-10-CM

## 2019-07-16 NOTE — Progress Notes (Signed)
   Covid-19 Vaccination [facility]  Name:  Jose [name]    MRN: [id number] DOB: 08/05/1970  07/16/2019  Jose [name] was observed post Covid-19 immunization for 15 minutes without incidence. He was provided with Vaccine Information Sheet and instruction to access the V-Safe system.   Jose [name] was instructed to call 911 with any severe reactions post vaccine: Marland Kitchen Difficulty breathing  . Swelling of your face and throat  . A fast heartbeat  . A bad rash all over your body  . Dizziness and weakness    Immunizations Administered    Name Date Dose VIS Date Route   Pfizer COVID-19 Vaccine 07/16/2019  4:35 PM 0.3 mL 05/07/2019 Intramuscular   Manufacturer: [place]   Lot: [id number]   [place]: [id number]

## 2019-07-21 ENCOUNTER — Encounter: Payer: Self-pay | Admitting: Internal Medicine

## 2019-07-21 ENCOUNTER — Other Ambulatory Visit: Payer: Self-pay

## 2019-07-21 ENCOUNTER — Ambulatory Visit (INDEPENDENT_AMBULATORY_CARE_PROVIDER_SITE_OTHER): Payer: 59 | Admitting: Internal Medicine

## 2019-07-21 VITALS — BP 124/82 | HR 85 | Temp 96.2°F | Resp 16 | Ht 70.0 in | Wt 220.0 lb

## 2019-07-21 DIAGNOSIS — Z0000 Encounter for general adult medical examination without abnormal findings: Secondary | ICD-10-CM | POA: Diagnosis not present

## 2019-07-21 LAB — LIPID PANEL
Cholesterol: 171 mg/dL (ref 0–200)
HDL: 35.6 mg/dL — ABNORMAL LOW (ref >39.00)
NonHDL: 134.94
Total CHOL/HDL Ratio: 5
Triglycerides: 204 mg/dL — ABNORMAL HIGH (ref 0.0–149.0)
VLDL: 40.8 mg/dL — ABNORMAL HIGH (ref 0.0–40.0)

## 2019-07-21 LAB — CBC WITH DIFFERENTIAL/PLATELET
Basophils Absolute: 0.1 10*3/uL (ref 0.0–0.1)
Basophils Relative: 1 % (ref 0.0–3.0)
Eosinophils Absolute: 0.3 10*3/uL (ref 0.0–0.7)
Eosinophils Relative: 4.8 % (ref 0.0–5.0)
HCT: 42.7 % (ref 39.0–52.0)
Hemoglobin: 14.8 g/dL (ref 13.0–17.0)
Lymphocytes Relative: 21.3 % (ref 12.0–46.0)
Lymphs Abs: 1.5 10*3/uL (ref 0.7–4.0)
MCHC: 34.6 g/dL (ref 30.0–36.0)
MCV: 83.2 fl (ref 78.0–100.0)
Monocytes Absolute: 0.7 10*3/uL (ref 0.1–1.0)
Monocytes Relative: 9.9 % (ref 3.0–12.0)
Neutro Abs: 4.4 10*3/uL (ref 1.4–7.7)
Neutrophils Relative %: 63 % (ref 43.0–77.0)
Platelets: 320 10*3/uL (ref 150.0–400.0)
RBC: 5.13 Mil/uL (ref 4.22–5.81)
RDW: 13.7 % (ref 11.5–15.5)
WBC: 7 10*3/uL (ref 4.0–10.5)

## 2019-07-21 LAB — COMPREHENSIVE METABOLIC PANEL
ALT: 30 U/L (ref 0–53)
AST: 21 U/L (ref 0–37)
Albumin: 4.4 g/dL (ref 3.5–5.2)
Alkaline Phosphatase: 86 U/L (ref 39–117)
BUN: 9 mg/dL (ref 6–23)
CO2: 26 mEq/L (ref 19–32)
Calcium: 9.4 mg/dL (ref 8.4–10.5)
Chloride: 104 mEq/L (ref 96–112)
Creatinine, Ser: 0.83 mg/dL (ref 0.40–1.50)
GFR: 99.51 mL/min (ref >60.00)
Glucose, Bld: 88 mg/dL (ref 70–99)
Potassium: 4.3 mEq/L (ref 3.5–5.1)
Sodium: 137 mEq/L (ref 135–145)
Total Bilirubin: 0.5 mg/dL (ref 0.2–1.2)
Total Protein: 7 g/dL (ref 6.0–8.3)

## 2019-07-21 LAB — LDL CHOLESTEROL, DIRECT: Direct LDL: 103 mg/dL

## 2019-07-21 NOTE — Progress Notes (Signed)
Subjective:    Patient ID: Jose [name], male    DOB: [birth year], 49 y.o.   MRN: [id number]  DOS:  07/21/2019 Type of visit - description: CPX Since the last office visit he is doing well. He did report occasional allergy symptoms including clear runny nose, some sore throat. Symptoms are not severe. For the last month he describes a "weird" smell that improves w/ flushing his nose with normal saline.  Also occasionally left eye feels dry.  Does not use contact lenses.  Denies any blurred vision or light intolerance.  Wt Readings from Last 3 Encounters:  07/21/19 220 lb (99.8 kg)  07/15/18 216 lb 6 oz (98.1 kg)  06/29/18 218 lb 6 oz (99.1 kg)     Review of Systems  Other than above, a 14 point review of systems is negative     Past Medical History:  Diagnosis Date  . Dysphagia    EGD : occult stricture , Bx slt increaed eosinophiles  . Morton's neuroma   . RAD (reactive airway disease)    ??  . Recurrent sinusitis     Past Surgical History:  Procedure Laterality Date  . ESOPHAGOGASTRODUODENOSCOPY (EGD) WITH ESOPHAGEAL DILATION    . KNEE ARTHROSCOPY Right 03/2018   Family History  Problem Relation Age of Onset  . Arthritis Mother        Living  . Healthy Mother        Living  . Asthma Son   . Thyroid disease Sister   . Colon cancer Neg Hx   . Prostate cancer Neg Hx   . CAD Neg Hx   . Diabetes Neg Hx      Allergies as of 07/21/2019      Reactions   Nexium [esomeprazole Magnesium] Nausea Only      Medication List       Accurate as of July 21, 2019 11:59 PM. If you have any questions, ask your nurse or doctor.        azelastine 0.1 % nasal spray Commonly known as: ASTELIN Place 2 sprays into both nostrils at bedtime as needed for rhinitis. Use in each nostril as directed   polyethylene glycol powder 17 GM/SCOOP powder Commonly known as: GLYCOLAX/MIRALAX Take 17 g by mouth daily.             Objective:   Physical Exam BP 124/82 (BP  Location: Left Arm, Patient Position: Sitting, Cuff Size: Normal)   Pulse 85   Temp (!) 96.2 F (35.7 C) (Temporal)   Resp 16   Ht 5\' 10"  (1.778 m)   Wt 220 lb (99.8 kg)   SpO2 98%   BMI 31.57 kg/m  General: Well developed, NAD, BMI noted Neck: No  thyromegaly  HEENT:  Normocephalic . Face symmetric, atraumatic. EOMI, pupils equal and reactive, conjunctiva is not red, anterior chambers normal. Nose: Turbinate with minimal redness and no discharge. Lungs:  CTA B Normal respiratory effort, no intercostal retractions, no accessory muscle use. Heart: RRR,  no murmur.  Abdomen:  Not distended, soft, non-tender. No rebound or rigidity.   Lower extremities: no pretibial edema bilaterally  Skin: Exposed areas without rash. Not pale. Not jaundice Neurologic:  alert & oriented X3.  Speech normal, gait appropriate for age and unassisted Strength symmetric and appropriate for age.  Psych: Cognition and judgment appear intact.  Cooperative with normal attention span and concentration.  Behavior appropriate. No anxious or depressed appearing.     Assessment  Assessment Recurrent sinusitis ? Reactive airway disease GI: -Dysphagia: EGD x 2  >>> occult stricture, s/p stretching , BX increased eosinophils -Chronic constipation,  Cscope 02-2017, + polyps -Dyspepsia: abd [place] 02-2017: Contracted bladder without stones, saw GI, no surgical referral, CT abdomen 02/2017: Nonacute Paresthesias: onset 2014, chronic, feet > rest of the body, saw  neurology 2015, 2016 (Dr Posey Pronto). EMG 03-29-14 neg, MRI of the back in 09/2013 normal.  Intolerant to Neurontin, B6 was a slightly low, sxs decreased with OTC B12 supplements; slt better w/ Morton's neuroma local injection (2016)  PLAN: Here for CPX Reports a "weird" nasal smell : exam is   benign, gets better with saline flushing, recommend to continue doing that, call if symptoms persist Dry eye: With no red flags, see HPI, recommend to see the eye  doctor for a thorough eye exam.  GI problems: Currently asymptomatic. RTC 1 year    This visit occurred during the SARS-CoV-2 public health emergency.  Safety protocols were in place, including screening questions prior to the visit, additional usage of staff PPE, and extensive cleaning of exam room while observing appropriate contact time as indicated for disinfecting solutions.

## 2019-07-21 NOTE — Progress Notes (Signed)
Pre visit review using our clinic review tool, if applicable. No additional management support is needed unless otherwise documented below in the visit note. 

## 2019-07-21 NOTE — Patient Instructions (Signed)
GO TO THE LAB : Get the blood work     [place] Come back for a physical exam in 1 year, please make an appointment

## 2019-07-22 NOTE — Assessment & Plan Note (Signed)
Here for CPX Reports a "weird" nasal smell : exam is   benign, gets better with saline flushing, recommend to continue doing that, call if symptoms persist Dry eye: With no red flags, see HPI, recommend to see the eye doctor for a thorough eye exam.  GI problems: Currently asymptomatic. RTC 1 year

## 2019-07-22 NOTE — Assessment & Plan Note (Signed)
-   Tdap 2016 -S/p Covid shot x1. - CCS: Cscope (done for constipation)02-2017, 1 polyp, 5 years per pathology report -Prostate ca screening: not indicated - Labs: CMP, FLP, CBC  -Diet is typically healthy, has gained weight, he thinks is due to lack of exercise, encouraged to increase physical activity to 3 hours a week.

## 2019-08-10 ENCOUNTER — Ambulatory Visit: Payer: 59 | Attending: Internal Medicine

## 2019-08-10 DIAGNOSIS — Z23 Encounter for immunization: Secondary | ICD-10-CM

## 2019-08-10 NOTE — Progress Notes (Signed)
   Covid-19 Vaccination Clinic  Name:  HAZIM MORTER    MRN: [id number] DOB: 07/04/1970  08/10/2019  Mr. Creese was observed post Covid-19 immunization for 15 minutes without incident. He was provided with Vaccine Information Sheet and instruction to access the V-Safe system.   Mr. Bell was instructed to call 911 with any severe reactions post vaccine: Marland Kitchen Difficulty breathing  . Swelling of face and throat  . A fast heartbeat  . A bad rash all over body  . Dizziness and weakness   Immunizations Administered    Name Date Dose VIS Date Route   Pfizer COVID-19 Vaccine 08/10/2019  3:09 PM 0.3 mL 05/07/2019 Intramuscular   Manufacturer: [place]   Lot: [id number]   [place]: [id number]

## 2020-07-21 ENCOUNTER — Ambulatory Visit: Payer: 59 | Admitting: Internal Medicine

## 2020-07-28 ENCOUNTER — Ambulatory Visit (INDEPENDENT_AMBULATORY_CARE_PROVIDER_SITE_OTHER): Payer: 59 | Admitting: Internal Medicine

## 2020-07-28 ENCOUNTER — Encounter: Payer: Self-pay | Admitting: Internal Medicine

## 2020-07-28 ENCOUNTER — Other Ambulatory Visit: Payer: Self-pay

## 2020-07-28 VITALS — BP 122/80 | HR 104 | Temp 97.7°F | Resp 16 | Ht 70.0 in | Wt 230.2 lb

## 2020-07-28 DIAGNOSIS — Z0000 Encounter for general adult medical examination without abnormal findings: Secondary | ICD-10-CM | POA: Diagnosis not present

## 2020-07-28 MED ORDER — AZELASTINE HCL 0.1 % NA SOLN: 2.0000 | Freq: Every evening | NASAL | 12 refills | Status: DC | PRN

## 2020-07-28 NOTE — Progress Notes (Signed)
Subjective:    Patient ID: Jose [name], male    DOB: 03-07-1971, 50 y.o.   MRN: [id number]  DOS:  07/28/2020 Type of visit - description: CPX  Since the last office visit is doing well. From time to time his ear feels stopped up, would like a refill on Astelin which seems to help. Other than that has no new symptoms.  Wt Readings from Last 3 Encounters:  07/28/20 230 lb 4 oz (104.4 kg)  07/21/19 220 lb (99.8 kg)  07/15/18 216 lb 6 oz (98.1 kg)     Review of Systems  Other than above, a 14 point review of systems is negative      Past Medical History:  Diagnosis Date  . Dysphagia    EGD : occult stricture , Bx slt increaed eosinophiles  . Morton's neuroma   . RAD (reactive airway disease)    ??  . Recurrent sinusitis     Past Surgical History:  Procedure Laterality Date  . ESOPHAGOGASTRODUODENOSCOPY (EGD) WITH ESOPHAGEAL DILATION    . KNEE ARTHROSCOPY Right 03/2018    Allergies as of 07/28/2020      Reactions   Nexium [esomeprazole Magnesium] Nausea Only      Medication List       Accurate as of July 28, 2020 11:59 PM. If you have any questions, ask your nurse or doctor.        STOP taking these medications   polyethylene glycol powder 17 GM/SCOOP powder Commonly known as: GLYCOLAX/MIRALAX Stopped by: Kathlene November, MD     TAKE these medications   azelastine 0.1 % nasal spray Commonly known as: ASTELIN Place 2 sprays into both nostrils at bedtime as needed for rhinitis or allergies. Use in each nostril as directed What changed: reasons to take this Changed by: Kathlene November, MD          Objective:   Physical Exam BP 122/80 (BP Location: Left Arm, Patient Position: Sitting, Cuff Size: Normal)   Pulse (!) 104   Temp 97.7 F (36.5 C) (Oral)   Resp 16   Ht 5\' 10"  (1.778 m)   Wt 230 lb 4 oz (104.4 kg)   SpO2 98%   BMI 33.04 kg/m  General: Well developed, NAD, BMI noted Neck: No  thyromegaly  HEENT:  Normocephalic . Face symmetric, atraumatic.  TMs  are slightly bulged.  Not red. Lungs:  CTA B Normal respiratory effort, no intercostal retractions, no accessory muscle use. Heart: RRR,  no murmur.  Abdomen:  Not distended, soft, non-tender. No rebound or rigidity.   Lower extremities: no pretibial edema bilaterally  Skin: Exposed areas without rash. Not pale. Not jaundice Neurologic:  alert & oriented X3.  Speech normal, gait appropriate for age and unassisted Strength symmetric and appropriate for age.  Psych: Cognition and judgment appear intact.  Cooperative with normal attention span and concentration.  Behavior appropriate. No anxious or depressed appearing.     Assessment     Assessment Recurrent sinusitis ? Reactive airway disease GI: -Dysphagia: EGD x 2  >>> occult stricture, s/p stretching , BX increased eosinophils -Chronic constipation,  Cscope 02-2017, + polyps -Dyspepsia: abd [place] 02-2017: Contracted bladder without stones, saw GI, no surgical referral, CT abdomen 02/2017: Nonacute Paresthesias: onset 2014, chronic, feet > rest of the body, saw  neurology 2015, 2016 (Dr Posey Pronto). EMG 03-29-14 neg, MRI of the back in 09/2013 normal.  Intolerant to Neurontin, B6 was a slightly low, sxs decreased with OTC B12 supplements; slt  better w/ Morton's neuroma local injection (2016) Covid infex: ~ 05/2020    PLAN: Here for CPX Slightly tachycardic upon arrival, heart rate recheck: 85. No GI symptoms lately, taking  Apple Cider Vinegar which helps significantly Refill Astelin and recommend Flonase, he takes for episodic ear discomfort. No other concerns today. RTC 1 year   This visit occurred during the SARS-CoV-2 public health emergency.  Safety protocols were in place, including screening questions prior to the visit, additional usage of staff PPE, and extensive cleaning of exam room while observing appropriate contact time as indicated for disinfecting solutions.

## 2020-07-28 NOTE — Patient Instructions (Addendum)
Please go to the basement at  [place] across from the [facility]  Is a walk-in facility, get your blood work.   Use Astelin twice daily as needed You can also use Flonase  Please schedule a follow-up for a physical exam in 1 year

## 2020-07-29 ENCOUNTER — Encounter: Payer: Self-pay | Admitting: Internal Medicine

## 2020-07-29 NOTE — Assessment & Plan Note (Signed)
-   Tdap 2016 -S/p Covid shot x2.  Declines a booster for now, benefits discussed - declines flu shot today - CCS: Cscope (done for constipation)02-2017, 1 polyp, 5 years per pathology report -Prostate ca screening: not indicated - Labs: To get labs at [facility] >>>  CMP, FLP, CBC, A1c, TSH, hep C -Has gained weight lately, with talk about diet, exercise.  He is determined to do better.

## 2020-07-29 NOTE — Assessment & Plan Note (Signed)
Here for CPX Slightly tachycardic upon arrival, heart rate recheck: 85. No GI symptoms lately, taking  Apple Cider Vinegar which helps significantly Refill Astelin and recommend Flonase, he takes for episodic ear discomfort. No other concerns today. RTC 1 year

## 2020-08-02 ENCOUNTER — Other Ambulatory Visit (INDEPENDENT_AMBULATORY_CARE_PROVIDER_SITE_OTHER): Payer: 59

## 2020-08-02 DIAGNOSIS — Z0000 Encounter for general adult medical examination without abnormal findings: Secondary | ICD-10-CM | POA: Diagnosis not present

## 2020-08-02 LAB — CBC WITH DIFFERENTIAL/PLATELET
Basophils Absolute: 0.1 10*3/uL (ref 0.0–0.1)
Basophils Relative: 1.2 % (ref 0.0–3.0)
Eosinophils Absolute: 0.4 10*3/uL (ref 0.0–0.7)
Eosinophils Relative: 5.7 % — ABNORMAL HIGH (ref 0.0–5.0)
HCT: 43.1 % (ref 39.0–52.0)
Hemoglobin: 15.2 g/dL (ref 13.0–17.0)
Lymphocytes Relative: 24.1 % (ref 12.0–46.0)
Lymphs Abs: 1.6 10*3/uL (ref 0.7–4.0)
MCHC: 35.2 g/dL (ref 30.0–36.0)
MCV: 81.9 fl (ref 78.0–100.0)
Monocytes Absolute: 0.7 10*3/uL (ref 0.1–1.0)
Monocytes Relative: 10.5 % (ref 3.0–12.0)
Neutro Abs: 3.9 10*3/uL (ref 1.4–7.7)
Neutrophils Relative %: 58.5 % (ref 43.0–77.0)
Platelets: 314 10*3/uL (ref 150.0–400.0)
RBC: 5.27 Mil/uL (ref 4.22–5.81)
RDW: 14 % (ref 11.5–15.5)
WBC: 6.7 10*3/uL (ref 4.0–10.5)

## 2020-08-02 LAB — LIPID PANEL
Cholesterol: 173 mg/dL (ref 0–200)
HDL: 38.5 mg/dL — ABNORMAL LOW (ref >39.00)
NonHDL: 134.82
Total CHOL/HDL Ratio: 5
Triglycerides: 230 mg/dL — ABNORMAL HIGH (ref 0.0–149.0)
VLDL: 46 mg/dL — ABNORMAL HIGH (ref 0.0–40.0)

## 2020-08-02 LAB — HEMOGLOBIN A1C: Hgb A1c MFr Bld: 5.5 % (ref 4.6–6.5)

## 2020-08-02 LAB — COMPREHENSIVE METABOLIC PANEL
ALT: 27 U/L (ref 0–53)
AST: 19 U/L (ref 0–37)
Albumin: 4.3 g/dL (ref 3.5–5.2)
Alkaline Phosphatase: 82 U/L (ref 39–117)
BUN: 9 mg/dL (ref 6–23)
CO2: 24 mEq/L (ref 19–32)
Calcium: 9.1 mg/dL (ref 8.4–10.5)
Chloride: 104 mEq/L (ref 96–112)
Creatinine, Ser: 0.83 mg/dL (ref 0.40–1.50)
GFR: 104.18 mL/min (ref >60.00)
Glucose, Bld: 87 mg/dL (ref 70–99)
Potassium: 4.1 mEq/L (ref 3.5–5.1)
Sodium: 138 mEq/L (ref 135–145)
Total Bilirubin: 0.6 mg/dL (ref 0.2–1.2)
Total Protein: 7.3 g/dL (ref 6.0–8.3)

## 2020-08-02 LAB — LDL CHOLESTEROL, DIRECT: Direct LDL: 100 mg/dL

## 2020-08-02 LAB — TSH: TSH: 2.07 u[IU]/mL (ref 0.35–4.50)

## 2020-08-03 LAB — HEPATITIS C ANTIBODY
Hepatitis C Ab: NONREACTIVE
SIGNAL TO CUT-OFF: 0 (ref <1.00)

## 2021-08-01 ENCOUNTER — Encounter: Payer: 59 | Admitting: Internal Medicine

## 2021-08-21 ENCOUNTER — Ambulatory Visit (INDEPENDENT_AMBULATORY_CARE_PROVIDER_SITE_OTHER): Payer: 59 | Admitting: Internal Medicine

## 2021-08-21 ENCOUNTER — Encounter: Payer: Self-pay | Admitting: Internal Medicine

## 2021-08-21 VITALS — BP 126/80 | HR 73 | Temp 98.5°F | Resp 16 | Ht 70.0 in | Wt 235.5 lb

## 2021-08-21 DIAGNOSIS — Z0000 Encounter for general adult medical examination without abnormal findings: Secondary | ICD-10-CM | POA: Diagnosis not present

## 2021-08-21 NOTE — Assessment & Plan Note (Signed)
Here for CPX ?Recurrent sinusitis: Has seasonal sinus congestion & left ear fullness, likely allergic sinusitis.  Recommend consistent use of Flonase, Astepro, a over-the-counter antihistaminic during high allergy season. ?RTC 1 year ?

## 2021-08-21 NOTE — Patient Instructions (Addendum)
By October call gastroenterology to set up a colonoscopy ?[phone] ? ? ?GO TO THE LAB : Get the blood work   ? ? ?[place], [place] ?Come back for   a physical in 1 year ? ? ?Allergies: ? ?-Use over-the-counter Flonase: 2 nasal sprays on each side of the nose in the morning   ? ?-Use OTC Astepro 2 nasal sprays on each side of the nose twice daily  ? ?- Allegra oral medicine daily OTC ? ?You can do your fasting blood work at [facility] office, walk-in ok ? ?

## 2021-08-21 NOTE — Progress Notes (Signed)
? ?Subjective:  ? ? Patient ID: Jose [name], male    DOB: 1971/04/12, 51 y.o.   MRN: [id number] ? ?DOS:  08/21/2021 ?Type of visit - description: CPX ? ?Here for CPX ?Since last office visit he is doing okay. ?Started with some sinus congestion and left ear popping few weeks ago.  Using Astepro as needed. ?No fever chills ? ?Wt Readings from Last 3 Encounters:  ?08/21/21 235 lb 8 oz (106.8 kg)  ?07/28/20 230 lb 4 oz (104.4 kg)  ?07/21/19 220 lb (99.8 kg)  ? ? ? ?Review of Systems ? ?Other than above, a 14 point review of systems is negative  ? ?  ? ? ?Past Medical History:  ?Diagnosis Date  ? Dysphagia   ? EGD : occult stricture , Bx slt increaed eosinophiles  ? Morton's neuroma   ? RAD (reactive airway disease)   ? ??  ? Recurrent sinusitis   ? ? ?Past Surgical History:  ?Procedure Laterality Date  ? ESOPHAGOGASTRODUODENOSCOPY (EGD) WITH ESOPHAGEAL DILATION    ? KNEE ARTHROSCOPY Right 03/2018  ? ?Social History  ? ?Socioeconomic History  ? Marital status: Married  ?  Spouse name: Not on file  ? Number of children: 2  ? Years of education: Not on file  ? Highest education level: Not on file  ?Occupational History  ? 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 Smoking status: Never  ? Smokeless tobacco: Never  ?Vaping Use  ? Vaping Use: Never used  ?Substance and Sexual Activity  ? Alcohol use: No  ?  Alcohol/week: 0.0 standard drinks  ?  Comment: rare   ? Drug use: No  ? Sexual activity: Not on file  ?Other Topics Concern  ? Not on file  ?Social History Narrative  ? 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   ? 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   38 son  and 60 girl  ? Lives with wife and two children in a 2 story home.    ?    ? ?Social Determinants of Health  ? ?Financial Resource Strain: Not on file  ?Food Insecurity: Not on file  ?Transportation Needs: Not on file  ?Physical Activity: Not on file  ?Stress: Not on file  ?Social Connections: Not on file  ?Intimate Partner Violence: Not on file  ? ? ?Current Outpatient  Medications  ?Medication Instructions  ? azelastine (ASTELIN) 0.1 % nasal spray 2 sprays, Each Nare, At bedtime PRN, Use in each nostril as directed  ? ? ?   ?Objective:  ? Physical Exam ?BP 126/80 (BP Location: Left Arm, Patient Position: Sitting, Cuff Size: Small)   Pulse 73   Temp 98.5 ?F (36.9 ?C) (Oral)   Resp 16   Ht '5\' 10"'$  (1.778 m)   Wt 235 lb 8 oz (106.8 kg)   SpO2 98%   BMI 33.79 kg/m?  ?General: ?Well developed, NAD, BMI noted ?Neck: No  thyromegaly  ?HEENT:  ?Normocephalic . Face symmetric, atraumatic. ?TMs: Slightly bulged bilaterally, no red ?Lungs:  ?CTA B ?Normal respiratory effort, no intercostal retractions, no accessory muscle use. ?Heart: RRR,  no murmur.  ?Abdomen:  ?Not distended, soft, non-tender. No rebound or rigidity.   ?Lower extremities: no pretibial edema bilaterally  ?Skin: Exposed areas without rash. Not pale. Not jaundice ?Neurologic:  ?alert & oriented X3.  ?Speech normal, gait appropriate for age and unassisted ?Strength symmetric and appropriate for age.  ?Psych: ?Cognition and judgment appear intact.  ?Cooperative with normal attention span  and concentration.  ?Behavior appropriate. ?No anxious or depressed appearing. ? ?   ?Assessment   ? ?Assessment ?Recurrent sinusitis ?? Reactive airway disease ?GI: ?-Dysphagia: EGD x 2  >>> occult stricture, s/p stretching , BX increased eosinophils ?-Chronic constipation,  Cscope 02-2017, + polyps ?-Dyspepsia: abd [place] 02-2017: Contracted bladder without stones, saw GI, no surgical referral, CT abdomen 02/2017: Nonacute ?Paresthesias: onset 2014, chronic, feet > rest of the body, saw  neurology 2015, 2016 (Dr Posey Pronto). EMG 03-29-14 neg, MRI of the back in 09/2013 normal.  Intolerant to Neurontin, B6 was a slightly low, sxs decreased with OTC B12 supplements; slt better w/ Morton's neuroma local injection (2016) ?Covid infex: ~ 05/2020  ? ? ?PLAN: ?Here for CPX ?Recurrent sinusitis: Has seasonal sinus congestion & left ear fullness, likely  allergic sinusitis.  Recommend consistent use of Flonase, Astepro, a over-the-counter antihistaminic during high allergy season. ?RTC 1 year ? ? ? ?This visit occurred during the SARS-CoV-2 public health emergency.  Safety protocols were in place, including screening questions prior to the visit, additional usage of staff PPE, and extensive cleaning of exam room while observing appropriate contact time as indicated for disinfecting solutions.  ? ?

## 2021-08-21 NOTE — Assessment & Plan Note (Signed)
-   Tdap 2016 ?-S/p Covid shot x2.   booster d/w pt  ?- CCS: Cscope (done for constipation)02-2017, 1 polyp, 5 years per pathology report.  Patient aware.  See AVS ?-Prostate ca screening: not indicated ?- Labs: To get labs at [place], FLP, CBC, TSH ?- Weight gain noted again this year, with talk about diet and exercise today. ?  ?  ?

## 2021-08-29 ENCOUNTER — Telehealth: Payer: Self-pay | Admitting: Internal Medicine

## 2021-08-29 ENCOUNTER — Other Ambulatory Visit (INDEPENDENT_AMBULATORY_CARE_PROVIDER_SITE_OTHER): Payer: 59

## 2021-08-29 DIAGNOSIS — Z0000 Encounter for general adult medical examination without abnormal findings: Secondary | ICD-10-CM | POA: Diagnosis not present

## 2021-08-29 LAB — COMPREHENSIVE METABOLIC PANEL
ALT: 30 U/L (ref 0–53)
AST: 22 U/L (ref 0–37)
Albumin: 4.5 g/dL (ref 3.5–5.2)
Alkaline Phosphatase: 88 U/L (ref 39–117)
BUN: 10 mg/dL (ref 6–23)
CO2: 27 mEq/L (ref 19–32)
Calcium: 9.2 mg/dL (ref 8.4–10.5)
Chloride: 103 mEq/L (ref 96–112)
Creatinine, Ser: 0.83 mg/dL (ref 0.40–1.50)
GFR: 103.39 mL/min (ref >60.00)
Glucose, Bld: 90 mg/dL (ref 70–99)
Potassium: 4.1 mEq/L (ref 3.5–5.1)
Sodium: 139 mEq/L (ref 135–145)
Total Bilirubin: 0.6 mg/dL (ref 0.2–1.2)
Total Protein: 7.1 g/dL (ref 6.0–8.3)

## 2021-08-29 LAB — LIPID PANEL
Cholesterol: 160 mg/dL (ref 0–200)
HDL: 39.5 mg/dL (ref >39.00)
LDL Cholesterol: 92 mg/dL (ref 0–99)
NonHDL: 120.21
Total CHOL/HDL Ratio: 4
Triglycerides: 141 mg/dL (ref 0.0–149.0)
VLDL: 28.2 mg/dL (ref 0.0–40.0)

## 2021-08-29 LAB — CBC WITH DIFFERENTIAL/PLATELET
Basophils Absolute: 0.1 10*3/uL (ref 0.0–0.1)
Basophils Relative: 1.4 % (ref 0.0–3.0)
Eosinophils Absolute: 0.2 10*3/uL (ref 0.0–0.7)
Eosinophils Relative: 2.8 % (ref 0.0–5.0)
HCT: 42.9 % (ref 39.0–52.0)
Hemoglobin: 14.9 g/dL (ref 13.0–17.0)
Lymphocytes Relative: 26.7 % (ref 12.0–46.0)
Lymphs Abs: 1.7 10*3/uL (ref 0.7–4.0)
MCHC: 34.8 g/dL (ref 30.0–36.0)
MCV: 81.9 fl (ref 78.0–100.0)
Monocytes Absolute: 0.7 10*3/uL (ref 0.1–1.0)
Monocytes Relative: 11.6 % (ref 3.0–12.0)
Neutro Abs: 3.6 10*3/uL (ref 1.4–7.7)
Neutrophils Relative %: 57.5 % (ref 43.0–77.0)
Platelets: 316 10*3/uL (ref 150.0–400.0)
RBC: 5.23 Mil/uL (ref 4.22–5.81)
RDW: 14.2 % (ref 11.5–15.5)
WBC: 6.2 10*3/uL (ref 4.0–10.5)

## 2021-08-29 LAB — TSH: TSH: 1.7 u[IU]/mL (ref 0.35–5.50)

## 2021-08-29 MED ORDER — AZELASTINE HCL 0.1 % NA SOLN: 2.0000 | Freq: Every evening | NASAL | 12 refills | Status: DC | PRN

## 2021-08-29 NOTE — Telephone Encounter (Signed)
Rx sent 

## 2021-08-29 NOTE — Telephone Encounter (Signed)
Pt states Dr.paz told him he would refill his old nasal spray. Please advise.  ? ?Medication: azelastine (ASTELIN) 0.1 % nasal spray ? ?Has the patient contacted their pharmacy? Yes.   ? ? ?Preferred Pharmacy: [facility] # [address], [place]  ?[address] [place] [address]  ?Phone:  [phone]  Fax:  [phone]  ? ?

## 2022-03-19 ENCOUNTER — Encounter: Payer: Self-pay | Admitting: Gastroenterology

## 2022-03-28 ENCOUNTER — Encounter: Payer: Self-pay | Admitting: Gastroenterology

## 2022-04-04 ENCOUNTER — Encounter: Payer: Self-pay | Admitting: Gastroenterology

## 2022-04-04 ENCOUNTER — Ambulatory Visit: Payer: 59 | Admitting: Gastroenterology

## 2022-04-04 VITALS — BP 110/70 | HR 92 | Ht 70.0 in | Wt 231.0 lb

## 2022-04-04 DIAGNOSIS — R1010 Upper abdominal pain, unspecified: Secondary | ICD-10-CM | POA: Diagnosis not present

## 2022-04-04 DIAGNOSIS — R1319 Other dysphagia: Secondary | ICD-10-CM

## 2022-04-04 DIAGNOSIS — K5909 Other constipation: Secondary | ICD-10-CM

## 2022-04-04 DIAGNOSIS — Z86010 Personal history of colonic polyps: Secondary | ICD-10-CM

## 2022-04-04 MED ORDER — NA SULFATE-K SULFATE-MG SULF 17.5-3.13-1.6 GM/177ML PO SOLN: 1.0000 | Freq: Once | ORAL | 0 refills | Status: DC

## 2022-04-04 NOTE — Patient Instructions (Signed)
_______________________________________________________  If you are age 49 or older, your body mass index should be between 23-30. Your Body mass index is 33.15 kg/m. If this is out of the aforementioned range listed, please consider follow up with your Primary Care Provider.  If you are age 49 or younger, your body mass index should be between 19-25. Your Body mass index is 33.15 kg/m. If this is out of the aformentioned range listed, please consider follow up with your Primary Care Provider.   ________________________________________________________  The [facility] providers would like to encourage you to use [facility] to communicate with providers for non-urgent requests or questions.  Due to long hold times on the telephone, sending your provider a message by [facility] may be a faster and more efficient way to get a response.  Please allow 48 business hours for a response.  Please remember that this is for non-urgent requests.  _______________________________________________________  [name] have been scheduled for an endoscopy and colonoscopy. Please follow the written instructions given to you at your visit today. Please pick up your prep supplies at the pharmacy within the next 1-3 days. If you use inhalers (even only as needed), please bring them with you on the day of your procedure.   Due to recent changes in healthcare laws, you may see the results of your imaging and laboratory studies on MyChart before your provider has had a chance to review them.  We understand that in some cases there may be results that are confusing or concerning to you. Not all laboratory results come back in the same time frame and the provider may be waiting for multiple results in order to interpret others.  Please give [place] 48 hours in order for your provider to thoroughly review all the results before contacting the office for clarification of your results.   It was a pleasure to see you today!  Thank you for  trusting me with your gastrointestinal care!

## 2022-04-04 NOTE — Progress Notes (Signed)
[facility] Consult Note:  History: Jose [name] 04/04/2022  Referring provider: Colon Branch, MD  Reason for consult/chief complaint: Abdominal Pain (Mild discomfort off and on , upper abdominal area) and Constipation (Uses miralax -discuss any long term effects of usage per patient)   Subjective  HPI: Jose [name] was referred to see me for abdominal pain and constipation. He was seen here by Dr. Sharlett Iles in 2014 for chronic reflux and dysphagia, at which time he underwent upper endoscopy. When I saw him in October 2018 he described ongoing reflux symptoms but primarily generalized abdominal bloating with some left upper quadrant pain and constipation. Colonoscopy October 2018 -complete exam to the terminal ileum with good preparation.  Diminutive right colon tubular adenoma removed.  CTAP shortly afterward unrevealing for symptoms, incidental hepatic cysts seen. _______________________________  Jose [name] was here to discuss a couple of issues.  He has continued to have episodic bloated upper abdominal discomfort that seems worse with constipation.  He will take the MiraLAX on average twice a week, though when symptoms are worsening recently he took it every day for over a week.  Bowels were then moving better and he felt improved.  He has continued to have intermittent solid food dysphagia, sometimes meat or bread will feel stuck in the upper esophagus within the first few bites.  If it is bad enough, he may need to bring it back up.  He uncommonly has heartburn or regurgitation, and if that occurs it improves taking apple cider vinegar.   ROS:  Review of Systems  Constitutional:  Negative for appetite change and unexpected weight change.  HENT:  Negative for mouth sores and voice change.   Eyes:  Negative for pain and redness.  Respiratory:  Negative for cough and shortness of breath.   Cardiovascular:  Negative for chest pain and palpitations.  Genitourinary:   Negative for dysuria and hematuria.  Musculoskeletal:  Negative for arthralgias and myalgias.  Skin:  Negative for pallor and rash.  Allergic/Immunologic: Positive for environmental allergies.  Neurological:  Negative for weakness and headaches.  Hematological:  Negative for adenopathy.     Past Medical History: Past Medical History:  Diagnosis Date   Dysphagia    EGD : occult stricture , Bx slt increaed eosinophiles   Morton's neuroma    RAD (reactive airway disease)    ??   Recurrent sinusitis      Past Surgical History: Past Surgical History:  Procedure Laterality Date   ESOPHAGOGASTRODUODENOSCOPY (EGD) WITH ESOPHAGEAL DILATION     KNEE ARTHROSCOPY Right 03/2018     Family History: Family History  Problem Relation Age of Onset   Arthritis Mother        Living   Healthy Mother        Living   Thyroid disease Sister    Asthma Son        childhood   Colon cancer Neg Hx    Prostate cancer Neg Hx    CAD Neg Hx    Diabetes Neg Hx     Social History: Social History   Socioeconomic History   Marital status: Married    Spouse name: Not on file   Number of children: 2   Years of education: Not on file   Highest education level: Not on file  Occupational History   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   75 son  and 55 girl   Lives with wife and two children in a 2 story home.         Social Determinants of Health   Financial Resource Strain: Not on file  Food Insecurity: Not on file  Transportation Needs: Not on file  Physical Activity: Not on file  Stress: Not on  file  Social Connections: Not on file    Allergies: Allergies  Allergen Reactions   Nexium [Esomeprazole Magnesium] Nausea Only    Outpatient Meds: Current Outpatient Medications  Medication Sig Dispense Refill   azelastine (ASTELIN) 0.1 % nasal spray Place 2 sprays into both nostrils at bedtime as needed for rhinitis or allergies. Use in each nostril as directed 30 mL 12   No current facility-administered medications for this visit.      ___________________________________________________________________ Objective   Exam:  BP 110/70   Pulse 92   Ht '5\' 10"'$  (1.778 m)   Wt 231 lb (104.8 kg)   BMI 33.15 kg/m  Wt Readings from Last 3 Encounters:  04/04/22 231 lb (104.8 kg)  08/21/21 235 lb 8 oz (106.8 kg)  07/28/20 230 lb 4 oz (104.4 kg)    General: Well-appearing Eyes: sclera anicteric, no redness ENT: oral mucosa moist without lesions, no cervical or supraclavicular lymphadenopathy CV: Regular without murmur, no JVD, no peripheral edema Resp: clear to auscultation bilaterally, normal RR and effort noted GI: soft, no tenderness, with active bowel sounds. No guarding or palpable organomegaly noted. Skin; warm and dry, no rash or jaundice noted Neuro: awake, alert and oriented x 3. Normal gross motor function and fluent speech  Labs:   Assessment: Encounter Diagnoses  Name Primary?   Upper abdominal pain Yes   Esophageal dysphagia    Personal history of colonic polyps    Chronic constipation     The episodic bloated upper abdominal discomfort still seems related to underlying constipation.  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 Intermittent dysphagia may be esophageal spasm, stricture or EOE.  History of colon polyp  Plan:  Upper endoscopy with possible dilation and surveillance colonoscopy.  He was agreeable after discussion of  procedure and risks.  The benefits and risks of the planned procedure were described in detail with the patient or (when appropriate) their health care proxy.  Risks were outlined as including, but not limited to, bleeding, infection, perforation, adverse medication reaction leading to cardiac or pulmonary decompensation, pancreatitis (if ERCP).  The limitation of incomplete mucosal visualization was also discussed.  No guarantees or warranties were given.   Thank you for the courtesy of this consult.  Please call me with any questions or concerns.  Nelida Meuse III  CC: Referring provider noted above

## 2022-06-05 ENCOUNTER — Telehealth: Payer: Self-pay | Admitting: Gastroenterology

## 2022-06-05 NOTE — Telephone Encounter (Signed)
Patient reschd for procedure. Please update prep instructions.  Thank you

## 2022-06-10 ENCOUNTER — Encounter: Payer: 59 | Admitting: Gastroenterology

## 2022-06-11 NOTE — Telephone Encounter (Signed)
Instructions were updated on 04-05-2023

## 2022-06-12 ENCOUNTER — Encounter: Payer: 59 | Admitting: Gastroenterology

## 2022-06-24 ENCOUNTER — Encounter: Payer: 59 | Admitting: Gastroenterology

## 2022-07-23 ENCOUNTER — Encounter: Payer: Self-pay | Admitting: Gastroenterology

## 2022-07-25 ENCOUNTER — Telehealth: Payer: Self-pay | Admitting: Gastroenterology

## 2022-07-25 ENCOUNTER — Other Ambulatory Visit: Payer: Self-pay

## 2022-07-25 MED ORDER — NA SULFATE-K SULFATE-MG SULF 17.5-3.13-1.6 GM/177ML PO SOLN
1.0000 | Freq: Once | ORAL | 0 refills | Status: AC
Start: 2022-07-25 — End: 2022-07-25

## 2022-07-25 NOTE — Telephone Encounter (Signed)
PT needs prep instructions resent as well as the prep medication which should be sent to [facility]  in [place]

## 2022-07-25 NOTE — Telephone Encounter (Signed)
Instructions and prep have been sent as requested.

## 2022-07-31 ENCOUNTER — Ambulatory Visit (AMBULATORY_SURGERY_CENTER): Payer: 59 | Admitting: Gastroenterology

## 2022-07-31 ENCOUNTER — Encounter: Payer: Self-pay | Admitting: Gastroenterology

## 2022-07-31 VITALS — BP 111/74 | HR 74 | Temp 98.4°F | Resp 11 | Ht 70.0 in | Wt 225.0 lb

## 2022-07-31 DIAGNOSIS — D123 Benign neoplasm of transverse colon: Secondary | ICD-10-CM | POA: Diagnosis not present

## 2022-07-31 DIAGNOSIS — K222 Esophageal obstruction: Secondary | ICD-10-CM | POA: Diagnosis not present

## 2022-07-31 DIAGNOSIS — R1319 Other dysphagia: Secondary | ICD-10-CM

## 2022-07-31 DIAGNOSIS — Z86010 Personal history of colonic polyps: Secondary | ICD-10-CM | POA: Diagnosis not present

## 2022-07-31 DIAGNOSIS — Z09 Encounter for follow-up examination after completed treatment for conditions other than malignant neoplasm: Secondary | ICD-10-CM | POA: Diagnosis present

## 2022-07-31 DIAGNOSIS — K2100 Gastro-esophageal reflux disease with esophagitis, without bleeding: Secondary | ICD-10-CM | POA: Diagnosis not present

## 2022-07-31 DIAGNOSIS — K219 Gastro-esophageal reflux disease without esophagitis: Secondary | ICD-10-CM | POA: Diagnosis not present

## 2022-07-31 DIAGNOSIS — D122 Benign neoplasm of ascending colon: Secondary | ICD-10-CM

## 2022-07-31 DIAGNOSIS — D124 Benign neoplasm of descending colon: Secondary | ICD-10-CM

## 2022-07-31 MED ORDER — SODIUM CHLORIDE 0.9 % IV SOLN: 500.0000 mL | Freq: Once | INTRAVENOUS | Status: DC

## 2022-07-31 NOTE — Op Note (Signed)
[facility] Patient Name: Jose [name] Procedure Date: 07/31/2022 9:11 AM MRN: [id number] Endoscopist: Mallie Mussel L. Loletha [name] , MD, [id number] Age: 52 Referring MD:  Date of Birth: 02-Oct-1970 Gender: Male Account #: [id number] Procedure:                Colonoscopy Indications:              Surveillance: Personal history of adenomatous                            polyps on last colonoscopy 5 years ago                           Subcentimeter right colon tubular adenoma October                            2018 Medicines:                Monitored Anesthesia Care Procedure:                Pre-Anesthesia Assessment:                           - Prior to the procedure, a History and Physical                            was performed, and patient medications and                            allergies were reviewed. The patient's tolerance of                            previous anesthesia was also reviewed. The risks                            and benefits of the procedure and the sedation                            options and risks were discussed with the patient.                            All questions were answered, and informed consent                            was obtained. Prior Anticoagulants: The patient has                            taken no anticoagulant or antiplatelet agents. ASA                            Grade Assessment: II - A patient with mild systemic                            disease. After reviewing the risks and benefits,  the patient was deemed in satisfactory condition to                            undergo the procedure.                           After obtaining informed consent, the colonoscope                            was passed under direct vision. Throughout the                            procedure, the patient's blood pressure, pulse, and                            oxygen saturations were monitored continuously. The                             Olympus Colonoscope [id number] was introduced through                            the anus and advanced to the the cecum, identified                            by appendiceal orifice and ileocecal valve. The                            colonoscopy was performed without difficulty. The                            patient tolerated the procedure well. The quality                            of the bowel preparation was excellent. The                            ileocecal valve, appendiceal orifice, and rectum                            were photographed. The bowel preparation used was                            SUPREP. Scope In: 9:36:06 AM Scope Out: 9:53:46 AM Scope Withdrawal Time: 0 hours 14 minutes 33 seconds  Total Procedure Duration: 0 hours 17 minutes 40 seconds  Findings:                 The perianal and digital rectal examinations were                            normal.                           Repeat examination of right colon under NBI  performed.                           A single diverticulum was found in the right colon.                           Three sessile polyps were found in the transverse                            colon and ascending colon. The polyps were 3 to 5                            mm in size. These polyps were removed with a cold                            snare. Resection and retrieval were complete. (Jar                            1)                           A 6 mm polyp was found in the descending colon. The                            polyp was semi-pedunculated. The polyp was removed                            with a hot snare. Resection and retrieval were                            complete. (Jar 2)                           The exam was otherwise without abnormality on                            direct and retroflexion views. Complications:            No immediate complications. Estimated Blood Loss:     Estimated blood loss  was minimal. Impression:               - Diverticulosis in the right colon.                           - Three 3 to 5 mm polyps in the transverse colon                            and in the ascending colon, removed with a cold                            snare. Resected and retrieved.                           - One 6 mm polyp in the descending colon, removed  with a hot snare. Resected and retrieved.                           - The examination was otherwise normal on direct                            and retroflexion views. Recommendation:           - Patient has a contact number available for                            emergencies. The signs and symptoms of potential                            delayed complications were discussed with the                            patient. Return to normal activities tomorrow.                            Written discharge instructions were provided to the                            patient.                           - Resume previous diet.                           - Continue present medications.                           - Await pathology results.                           - Repeat colonoscopy is recommended for                            surveillance. The colonoscopy date will be                            determined after pathology results from today's                            exam become available for review.                           - See the other procedure note for documentation of                            additional recommendations. Jose Brinkmeier L. Loletha Carrow, MD 07/31/2022 10:07:35 AM This report has been signed electronically.

## 2022-07-31 NOTE — Progress Notes (Signed)
PT taken to PACU. Monitors in place. VSS. Report given to RN. 

## 2022-07-31 NOTE — Progress Notes (Signed)
History and Physical:  This patient presents for endoscopic testing for: Encounter Diagnoses  Name Primary?   Esophageal dysphagia Yes   Personal history of colonic polyps     Clinical details in my Nov 2023 office consult note, including the following: "The episodic bloated upper abdominal discomfort still seems related to underlying constipation.  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 Intermittent dysphagia may be esophageal spasm, stricture or EOE.   History of colon polyp' - diminutive right colon tubular adenoma Oct 2018  Patient is otherwise without complaints or active issues today.   Past Medical History: Past Medical History:  Diagnosis Date   Dysphagia    EGD : occult stricture , Bx slt increaed eosinophiles   Morton's neuroma    RAD (reactive airway disease)    ??   Recurrent sinusitis      Past Surgical History: Past Surgical History:  Procedure Laterality Date   ESOPHAGOGASTRODUODENOSCOPY (EGD) WITH ESOPHAGEAL DILATION     KNEE ARTHROSCOPY Right 03/2018    Allergies: Allergies  Allergen Reactions   Nexium [Esomeprazole Magnesium] Nausea Only    Outpatient Meds: Current Outpatient Medications  Medication Sig Dispense Refill   azelastine (ASTELIN) 0.1 % nasal spray Place 2 sprays into both nostrils at bedtime as needed for rhinitis or allergies. Use in each nostril as directed 30 mL 12   ibuprofen (ADVIL) 800 MG tablet      Current Facility-Administered Medications  Medication Dose Route Frequency Provider Last Rate Last Admin   0.9 %  sodium chloride infusion  500 mL Intravenous Once Doran Stabler, MD          ___________________________________________________________________ Objective   Exam:  BP 113/69   Pulse 85   Temp 98.4 F (36.9 C)   Ht '5\' 10"'$  (1.778 m)   Wt 225 lb (102.1 kg)   SpO2 97%   BMI 32.28  kg/m   CV: regular , S1/S2 Resp: clear to auscultation bilaterally, normal RR and effort noted GI: soft, no tenderness, with active bowel sounds.   Assessment: Encounter Diagnoses  Name Primary?   Esophageal dysphagia Yes   Personal history of colonic polyps      Plan: Colonoscopy EGD with biopsies and possible dilation   The patient is appropriate for an endoscopic procedure in the ambulatory setting.   - Wilfrid Lund, MD

## 2022-07-31 NOTE — Patient Instructions (Signed)
Upper-Clear liquid diet for 2 hours and then soft foods for the remainder of the day. Resume regular diet tomorrow. Awaiting pathology results. Return to office at appointment to be scheduled. Lower-Awaiting pathology results. Repeat Colonoscopy date to be determined based on pathology results.  YOU HAD AN ENDOSCOPIC PROCEDURE TODAY AT [facility]:   Refer to the procedure report that was given to you for any specific questions about what was found during the examination.  If the procedure report does not answer your questions, please call your gastroenterologist to clarify.  If you requested that your care partner not be given the details of your procedure findings, then the procedure report has been included in a sealed envelope for you to review at your convenience later.  YOU SHOULD EXPECT: Some feelings of bloating in the abdomen. Passage of more gas than usual.  Walking can help get rid of the air that was put into your GI tract during the procedure and reduce the bloating. If you had a lower endoscopy (such as a colonoscopy or flexible sigmoidoscopy) you may notice spotting of blood in your stool or on the toilet paper. If you underwent a bowel prep for your procedure, you may not have a normal bowel movement for a few days.  Please Note:  You might notice some irritation and congestion in your nose or some drainage.  This is from the oxygen used during your procedure.  There is no need for concern and it should clear up in a day or so.  SYMPTOMS TO REPORT IMMEDIATELY:  Following lower endoscopy (colonoscopy or flexible sigmoidoscopy):  Excessive amounts of blood in the stool  Significant tenderness or worsening of abdominal pains  Swelling of the abdomen that is new, acute  Fever of 100F or higher  Following upper endoscopy (EGD)  Vomiting of blood or coffee ground material  New chest pain or pain under the shoulder blades  Painful or persistently difficult  swallowing  New shortness of breath  Fever of 100F or higher  Black, tarry-looking stools  For urgent or emergent issues, a gastroenterologist can be reached at any hour by calling [phone]. Do not use MyChart messaging for urgent concerns.    DIET:  We do recommend a small meal at first, but then you may proceed to your regular diet.  Drink plenty of fluids but you should avoid alcoholic beverages for 24 hours.  ACTIVITY:  You should plan to take it easy for the rest of today and you should NOT DRIVE or use heavy machinery until tomorrow (because of the sedation medicines used during the test).    FOLLOW UP: Our staff will call the number listed on your records the next business day following your procedure.  We will call around 7:15- 8:00 am to check on you and address any questions or concerns that you may have regarding the information given to you following your procedure. If we do not reach you, we will leave a message.     If any biopsies were taken you will be contacted by phone or by letter within the next 1-3 weeks.  Please call us at [phone] if you have not heard about the biopsies in 3 weeks.    SIGNATURES/CONFIDENTIALITY: You and/or your care partner have signed paperwork which will be entered into your electronic medical record.  These signatures attest to the fact that that the information above on your After Visit Summary has been reviewed and is understood.  Full  responsibility of the confidentiality of this discharge information lies with you and/or your care-partner.

## 2022-07-31 NOTE — Progress Notes (Signed)
Called to room to assist during endoscopic procedure.  Patient ID and intended procedure confirmed with present staff. Received instructions for my participation in the procedure from the performing physician.  

## 2022-07-31 NOTE — Op Note (Signed)
[facility] Patient Name: Jose [name] Procedure Date: 07/31/2022 9:12 AM MRN: [id number] Endoscopist: Mallie Mussel L. Loletha Carrow , MD, [id number] Age: 52 Referring MD:  Date of Birth: 1970-06-23 Gender: Male Account #: [id number] Procedure:                Upper GI endoscopy Indications:              Esophageal dysphagia Medicines:                Monitored Anesthesia Care Procedure:                Pre-Anesthesia Assessment:                           - Prior to the procedure, a History and Physical                            was performed, and patient medications and                            allergies were reviewed. The patient's tolerance of                            previous anesthesia was also reviewed. The risks                            and benefits of the procedure and the sedation                            options and risks were discussed with the patient.                            All questions were answered, and informed consent                            was obtained. Prior Anticoagulants: The patient has                            taken no anticoagulant or antiplatelet agents. ASA                            Grade Assessment: II - A patient with mild systemic                            disease. After reviewing the risks and benefits,                            the patient was deemed in satisfactory condition to                            undergo the procedure.                           After obtaining informed consent, the endoscope was  passed under direct vision. Throughout the                            procedure, the patient's blood pressure, pulse, and                            oxygen saturations were monitored continuously. The                            Olympus Scope [id number] was introduced through the                            mouth, and advanced to the second part of duodenum.                            The upper GI endoscopy was  accomplished without                            difficulty. The patient tolerated the procedure                            well. Scope In: Scope Out: Findings:                 The larynx was normal.                           Diffuse mucosal changes characterized by                            longitudinal markings and tight circumferential                            folds were found in the lower third of the                            esophagus. Several biopsies were obtained in the                            middle third of the esophagus and in the lower                            third of the esophagus with cold forceps for                            evaluation of eosinophilic esophagitis.                           One benign-appearing, intrinsic severe stenosis was                            found at the gastroesophageal junction. This                            stenosis measured 6 mm (inner diameter) x 1 cm (  in                            length). Due to the degree of stenosis, the scope                            was unable to pass to the stomach initially. The                            stenosis was traversed after dilation. A TTS                            dilator was passed through the scope. Dilation with                            a 03-07-11 mm balloon dilator was performed to 12                            mm. The dilation site was examined and showed                            moderate mucosal disruption and moderate                            improvement in luminal narrowing.                           The stomach was normal.                           The cardia and gastric fundus were normal on                            retroflexion.                           The examined duodenum was normal. Complications:            No immediate complications. Estimated Blood Loss:     Estimated blood loss was minimal. Impression:               - Normal larynx.                           -  Longitudinally marked, tight circumferentially                            folded mucosa in the esophagus.                           - Benign-appearing esophageal stenosis. Dilated.                           - Normal stomach.                           - Normal examined duodenum.                           -  Several biopsies were obtained in the middle                            third of the esophagus and in the lower third of                            the esophagus. Recommendation:           - Patient has a contact number available for                            emergencies. The signs and symptoms of potential                            delayed complications were discussed with the                            patient. Return to normal activities tomorrow.                            Written discharge instructions were provided to the                            patient.                           - Clear liquid diet for 2 hours, then soft diet for                            the remainder of today. Resume regular diet                            tomorrow.                           - Continue present medications.                           - Await pathology results.                           - See the other procedure note for documentation of                            additional recommendations.                           - Return to my office at appointment to be                            scheduled. [name] L. Loletha Carrow, MD 07/31/2022 10:04:14 AM This report has been signed electronically.

## 2022-07-31 NOTE — Progress Notes (Signed)
Pt's states no medical or surgical changes since previsit or office visit. 

## 2022-08-01 ENCOUNTER — Telehealth: Payer: Self-pay | Admitting: *Deleted

## 2022-08-01 NOTE — Telephone Encounter (Signed)
Follow up call attempt.  LVM to call with any questions or concerns.

## 2022-08-05 ENCOUNTER — Encounter: Payer: Self-pay | Admitting: Gastroenterology

## 2022-08-09 ENCOUNTER — Other Ambulatory Visit: Payer: Self-pay

## 2022-08-09 MED ORDER — OMEPRAZOLE 40 MG PO CPDR: DELAYED_RELEASE_CAPSULE | ORAL | 0 refills | Status: DC

## 2022-08-23 ENCOUNTER — Encounter: Payer: 59 | Admitting: Internal Medicine

## 2022-08-23 ENCOUNTER — Ambulatory Visit: Payer: 59 | Admitting: Internal Medicine

## 2022-08-29 ENCOUNTER — Encounter: Payer: Self-pay | Admitting: Internal Medicine

## 2022-08-29 ENCOUNTER — Ambulatory Visit (INDEPENDENT_AMBULATORY_CARE_PROVIDER_SITE_OTHER): Payer: 59 | Admitting: Internal Medicine

## 2022-08-29 VITALS — BP 124/72 | HR 80 | Temp 98.0°F | Resp 16 | Ht 70.0 in | Wt 233.5 lb

## 2022-08-29 DIAGNOSIS — Z0000 Encounter for general adult medical examination without abnormal findings: Secondary | ICD-10-CM

## 2022-08-29 DIAGNOSIS — Z23 Encounter for immunization: Secondary | ICD-10-CM

## 2022-08-29 LAB — COMPREHENSIVE METABOLIC PANEL
ALT: 33 U/L (ref 0–53)
AST: 26 U/L (ref 0–37)
Albumin: 4.4 g/dL (ref 3.5–5.2)
Alkaline Phosphatase: 87 U/L (ref 39–117)
BUN: 12 mg/dL (ref 6–23)
CO2: 28 mEq/L (ref 19–32)
Calcium: 9 mg/dL (ref 8.4–10.5)
Chloride: 104 mEq/L (ref 96–112)
Creatinine, Ser: 0.71 mg/dL (ref 0.40–1.50)
GFR: 107.63 mL/min (ref >60.00)
Glucose, Bld: 72 mg/dL (ref 70–99)
Potassium: 4.3 mEq/L (ref 3.5–5.1)
Sodium: 138 mEq/L (ref 135–145)
Total Bilirubin: 0.5 mg/dL (ref 0.2–1.2)
Total Protein: 6.9 g/dL (ref 6.0–8.3)

## 2022-08-29 LAB — CBC WITH DIFFERENTIAL/PLATELET
Basophils Absolute: 0.1 10*3/uL (ref 0.0–0.1)
Basophils Relative: 1 % (ref 0.0–3.0)
Eosinophils Absolute: 0.1 10*3/uL (ref 0.0–0.7)
Eosinophils Relative: 2.1 % (ref 0.0–5.0)
HCT: 43.7 % (ref 39.0–52.0)
Hemoglobin: 15.1 g/dL (ref 13.0–17.0)
Lymphocytes Relative: 27.4 % (ref 12.0–46.0)
Lymphs Abs: 1.6 10*3/uL (ref 0.7–4.0)
MCHC: 34.5 g/dL (ref 30.0–36.0)
MCV: 83.6 fl (ref 78.0–100.0)
Monocytes Absolute: 0.6 10*3/uL (ref 0.1–1.0)
Monocytes Relative: 10.6 % (ref 3.0–12.0)
Neutro Abs: 3.5 10*3/uL (ref 1.4–7.7)
Neutrophils Relative %: 58.9 % (ref 43.0–77.0)
Platelets: 345 10*3/uL (ref 150.0–400.0)
RBC: 5.23 Mil/uL (ref 4.22–5.81)
RDW: 14.4 % (ref 11.5–15.5)
WBC: 6 10*3/uL (ref 4.0–10.5)

## 2022-08-29 LAB — LIPID PANEL
Cholesterol: 188 mg/dL (ref 0–200)
HDL: 40.6 mg/dL (ref >39.00)
LDL Cholesterol: 116 mg/dL — ABNORMAL HIGH (ref 0–99)
NonHDL: 147.19
Total CHOL/HDL Ratio: 5
Triglycerides: 155 mg/dL — ABNORMAL HIGH (ref 0.0–149.0)
VLDL: 31 mg/dL (ref 0.0–40.0)

## 2022-08-29 LAB — HEMOGLOBIN A1C: Hgb A1c MFr Bld: 5.6 % (ref 4.6–6.5)

## 2022-08-29 NOTE — Assessment & Plan Note (Signed)
Here for CPX Recurrent sinusitis: Had a lot of sinus congestion, took OTCs in the last few weeks, now better. Dysphagia: Saw GI, had a EGD/dilatation, doing better.  On PPIs Chronic constipation: On MiraLAX as needed Stress: Working long hours but is managing stress okay. RTC 1 year

## 2022-08-29 NOTE — Patient Instructions (Addendum)
Vaccines I recommend:  Covid booster     GO TO THE LAB : Get the blood work     GO TO THE FRONT DESK, PLEASE SCHEDULE YOUR APPOINTMENTS Come back for  a physical exam in 1 year 

## 2022-08-29 NOTE — Assessment & Plan Note (Signed)
-   Tdap today - Covid  booster d/w pt  - CCS: Cscope (done for constipation)02-2017, 1 polyp.  C-scope again 07/31/2022, next per GI -Prostate ca screening: not indicated, no symptoms - Labs: CMP FLP CBC A1c - diet-exercise d/w pt, main barrier to exercise more is lack of  time

## 2022-08-29 NOTE — Progress Notes (Signed)
Subjective:    Patient ID: Jose [name], male    DOB: 1971/02/15, 52 y.o.   MRN: [id number]  DOS:  08/29/2022 Type of visit - description: cpx  Here for CPX Had endoscopies. Had some dysphagia, that is better after the EGD.  Review of Systems See above   Past Medical History:  Diagnosis Date   Dysphagia    EGD : occult stricture , Bx slt increaed eosinophiles   Morton's neuroma    RAD (reactive airway disease)    ??   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   52 son  and 42 girl   Lives with wife and two children in a 2 story home.         Social Determinants of Health   Financial Resource Strain: Not on file  Food Insecurity: Not on file  Transportation Needs: Not on file  Physical Activity: Not on file  Stress: Not on file  Social Connections: Not on file  Intimate Partner Violence: Not on file     Current Outpatient Medications  Medication Instructions   omeprazole (PRILOSEC) 40 MG capsule Take 1 capsule (40 mg total) by mouth twice daily 30-60 minutes before breakfast and supper   polyethylene glycol (MIRALAX) 17 g, Oral, Daily PRN       Objective:   Physical Exam BP 124/72    Pulse 80   Temp 98 F (36.7 C) (Oral)   Resp 16   Ht 5\' 10"  (1.778 m)   Wt 233 lb 8 oz (105.9 kg)   SpO2 98%   BMI 33.50 kg/m  General: Well developed, NAD, BMI noted Neck: No  thyromegaly  HEENT:  Normocephalic . Face symmetric, atraumatic Lungs:  CTA B Normal respiratory effort, no intercostal retractions, no accessory muscle use. Heart: RRR,  no murmur.  Abdomen:  Not distended, soft, non-tender. No rebound or rigidity.   Lower extremities: no pretibial edema bilaterally  Skin: Exposed areas without rash. Not pale. Not jaundice Neurologic:  alert & oriented X3.  Speech normal, gait appropriate for age and unassisted Strength symmetric and appropriate for age.  Psych: Cognition and judgment appear intact.  Cooperative with normal attention span and concentration.  Behavior appropriate. No anxious or depressed appearing.     Assessment     Assessment Recurrent sinusitis ? Reactive airway disease GI: -Dysphagia: EGD x 2  >>> occult stricture, s/p stretching , BX increased eosinophils.  EGD again 07/31/2022 (had a dilatation) -Chronic constipation,  Cscope 02-2017, + polyps -Dyspepsia: abd [place] 02-2017: Contracted bladder without stones, saw  GI, no surgical referral, CT abdomen 02/2017: Nonacute Paresthesias: onset 2014, chronic, feet > rest of the body, saw  neurology 2015, 2016 (Dr Posey Pronto). EMG 03-29-14 neg, MRI of the back in 09/2013 normal.  Intolerant to Neurontin, B6 was a slightly low, sxs decreased with OTC B12 supplements; slt better w/ Morton's neuroma local injection (2016)    PLAN: Here for CPX Recurrent sinusitis: Had a lot of sinus congestion, took OTCs in the last few weeks, now better. Dysphagia: Saw GI, had a EGD/dilatation, doing better.  On PPIs Chronic constipation: On MiraLAX as needed Stress: Working long hours but is managing stress okay. RTC 1 year

## 2022-09-08 ENCOUNTER — Other Ambulatory Visit: Payer: Self-pay | Admitting: Gastroenterology

## 2022-10-02 ENCOUNTER — Ambulatory Visit: Payer: 59 | Admitting: Gastroenterology

## 2022-10-02 ENCOUNTER — Encounter: Payer: Self-pay | Admitting: Gastroenterology

## 2022-10-02 VITALS — BP 122/82 | HR 84 | Ht 70.0 in | Wt 237.0 lb

## 2022-10-02 DIAGNOSIS — K222 Esophageal obstruction: Secondary | ICD-10-CM | POA: Diagnosis not present

## 2022-10-02 DIAGNOSIS — R1319 Other dysphagia: Secondary | ICD-10-CM | POA: Diagnosis not present

## 2022-10-02 DIAGNOSIS — Z86010 Personal history of colonic polyps: Secondary | ICD-10-CM

## 2022-10-02 NOTE — Progress Notes (Signed)
[facility] Progress Note  Chief Complaint: GERD and esophageal stricture  Subjective  History: Jose [name] follows up after his recent EGD and colonoscopy on 07/31/22. Colonoscopy was a surveillance exam for history of subcentimeter adenomatous polyp in 2018.  On most recent exam, 4 subcentimeter tubular adenomas removed. EGD for dysphagia revealed a tight EG junction stricture through which only the deflated balloon dilator could pass, and this was treated with a 12 mm balloon dilation.  There were distal esophageal mucosal changes suggestive of possible EOE, though only 6 Eos/hpf on both distal and mid esophageal biopsies, making this appear more like GERD.  Omeprazole 40 mg twice daily prescribed. __________________________  Barbara Cower has felt well since I last saw him.  Taking omeprazole twice daily with no heartburn or dysphagia.  He really was having little in the way of heartburn or regurgitation prior to being on the medicine.  Appetite good and weight stable.  ROS: Cardiovascular:  no chest pain Respiratory: no dyspnea  The patient's Past Medical, Family and Social History were reviewed and are on file in the EMR.  Objective:  Med list reviewed  Current Outpatient Medications:    omeprazole (PRILOSEC) 40 MG capsule, TAKE 1 CAPSULE(40 MG) BY MOUTH TWICE DAILY 30 TO 60 MINUTES BEFORE BREAKFAST AND SUPPER, Disp: 60 capsule, Rfl: 0   polyethylene glycol (MIRALAX) 17 g packet, Take 17 g by mouth daily as needed., Disp: , Rfl:    Vital signs in last 24 hrs: Vitals:   10/02/22 0932  BP: 122/82  Pulse: 84   Wt Readings from Last 3 Encounters:  10/02/22 237 lb (107.5 kg)  08/29/22 233 lb 8 oz (105.9 kg)  07/31/22 225 lb (102.1 kg)    Physical Exam Well-appearing No additional exam today.  Entire visit spent in review of symptoms, results and plan  Labs:   ___________________________________________ Radiologic  studies:   ____________________________________________ Other:  1. Surgical [P], distal esophagus REFLUX ESOPHAGITIS (6 EOS/HPF) NEGATIVE FOR GLANDULAR EPITHELIUM, DYSPLASIA AND CARCINOMA 2. Surgical [P], mid esophagus REFLUX ESOPHAGITIS (6 EOS/HPF) NEGATIVE FOR GLANDULAR EPITHELIUM, DYSPLASIA AND CARCINOMA 3. Surgical [P], colon, transverse and ascending, polyp (3) TUBULAR ADENOMAS NEGATIVE FOR HIGH-GRADE DYSPLASIA AND CARCINOMA 4. Surgical [P], colon, descending, polyp (1) TUBULAR ADENOMA NEGATIVE FOR HIGH-GRADE DYSPLASIA AND CARCINOMA _____________________________________________ Assessment & Plan  Assessment: Encounter Diagnoses  Name Primary?   Esophageal dysphagia Yes   Personal history of colonic polyps    Esophageal stricture    Dysphagia from esophageal stricture that underwent balloon dilation, now significantly improved.  This is better than expected in my opinion since we were only able to perform a relatively small diameter balloon dilation.  I suspect he is also being more cautious with eating, and perhaps esophagitis has significantly improved on acid suppression.  Biopsies did not look like EOE, so I suspect he has perhaps been having more nocturnal reflux than he realized.  In that regard, we discussed the need for diet and lifestyle changes, particularly elevating head of bed and not eating within several hours of bedtime.   Plan: Reduce omeprazole to 40 mg once daily now  Repeat upper endoscopy in 6 to 8 weeks to assess esophagitis/mucosal changes and perform any further dilation if needed. After we see those results, discussed the best long-term management of this problem i.e. medicines and diet/lifestyle changes versus consideration of endoscopic or surgical antireflux procedures.  Jose [name] also asked about his colon polyps, I reassured him they were, type and also removed, and he  will be contacted for recall when appropriate.   Jose [name]

## 2022-10-02 NOTE — Patient Instructions (Signed)
_______________________________________________________  If your blood pressure at your visit was 140/90 or greater, please contact your primary care physician to follow up on this.  _______________________________________________________  If you are age 50 or older, your body mass index should be between 23-30. Your Body mass index is 34.01 kg/m. If this is out of the aforementioned range listed, please consider follow up with your Primary Care Provider.  If you are age 54 or younger, your body mass index should be between 19-25. Your Body mass index is 34.01 kg/m. If this is out of the aformentioned range listed, please consider follow up with your Primary Care Provider.   ________________________________________________________  The [facility] providers would like to encourage you to use [facility] to communicate with providers for non-urgent requests or questions.  Due to long hold times on the telephone, sending your provider a message by [facility] may be a faster and more efficient way to get a response.  Please allow 48 business hours for a response.  Please remember that this is for non-urgent requests.  _______________________________________________________  Bonita Quin have been scheduled for an endoscopy. Please follow written instructions given to you at your visit today. If you use inhalers (even only as needed), please bring them with you on the day of your procedure.

## 2022-10-15 ENCOUNTER — Other Ambulatory Visit: Payer: Self-pay | Admitting: Gastroenterology

## 2022-11-26 ENCOUNTER — Encounter: Payer: Self-pay | Admitting: Gastroenterology

## 2022-11-26 ENCOUNTER — Ambulatory Visit: Payer: 59 | Admitting: Gastroenterology

## 2022-11-26 VITALS — BP 125/81 | HR 78 | Temp 97.8°F | Resp 19 | Ht 70.0 in | Wt 237.0 lb

## 2022-11-26 DIAGNOSIS — K222 Esophageal obstruction: Secondary | ICD-10-CM | POA: Diagnosis not present

## 2022-11-26 DIAGNOSIS — R1319 Other dysphagia: Secondary | ICD-10-CM

## 2022-11-26 MED ORDER — SODIUM CHLORIDE 0.9 % IV SOLN
500.0000 mL | Freq: Once | INTRAVENOUS | Status: DC
Start: 2022-11-26 — End: 2023-09-01

## 2022-11-26 NOTE — Patient Instructions (Addendum)
YOU HAD AN ENDOSCOPIC PROCEDURE TODAY AT THE [facility]:   Refer to the procedure report that was given to you for any specific questions about what was found during the examination.  If the procedure report does not answer your questions, please call your gastroenterologist to clarify.  If you requested that your care partner not be given the details of your procedure findings, then the procedure report has been included in a sealed envelope for you to review at your convenience later.  YOU SHOULD EXPECT: Some feelings of bloating in the abdomen. Passage of more gas than usual.  Walking can help get rid of the air that was put into your GI tract during the procedure and reduce the bloating. If you had a lower endoscopy (such as a colonoscopy or flexible sigmoidoscopy) you may notice spotting of blood in your stool or on the toilet paper. If you underwent a bowel prep for your procedure, you may not have a normal bowel movement for a few days.  Please Note:  You might notice some irritation and congestion in your nose or some drainage.  This is from the oxygen used during your procedure.  There is no need for concern and it should clear up in a day or so.  SYMPTOMS TO REPORT IMMEDIATELY:   Following upper endoscopy (EGD)  Vomiting of blood or coffee ground material  New chest pain or pain under the shoulder blades  Painful or persistently difficult swallowing  New shortness of breath  Fever of 100F or higher  Black, tarry-looking stools  For urgent or emergent issues, a gastroenterologist can be reached at any hour by calling [phone]. Do not use MyChart messaging for urgent concerns.    DIET:  We do recommend a small meal at first, but then you may proceed to your regular diet.  Drink plenty of fluids but you should avoid alcoholic beverages for 24 hours.  MEDICATIONS: Continue present medications, Continue once daily PPI (Prilosec/Omeprazole).  FOLLOW UP: Follow up  with Dr. Myrtie Neither in his office in 2-3 months. Scheduled first available appointment, Oct. 2, 2024 at 0920 am.  Please see handouts given to you by your recovery nurse: Esophageal stricture.  Thank you for allowing [place] to provide for your healthcare needs today.  ACTIVITY:  You should plan to take it easy for the rest of today and you should NOT DRIVE or use heavy machinery until tomorrow (because of the sedation medicines used during the test).    FOLLOW UP: Our staff will call the number listed on your records the next business day following your procedure.  We will call around 7:15- 8:00 am to check on you and address any questions or concerns that you may have regarding the information given to you following your procedure. If we do not reach you, we will leave a message.     If any biopsies were taken you will be contacted by phone or by letter within the next 1-3 weeks.  Please call us at [phone] if you have not heard about the biopsies in 3 weeks.    SIGNATURES/CONFIDENTIALITY: You and/or your care partner have signed paperwork which will be entered into your electronic medical record.  These signatures attest to the fact that that the information above on your After Visit Summary has been reviewed and is understood.  Full responsibility of the confidentiality of this discharge information lies with you and/or your care-partner.

## 2022-11-26 NOTE — Op Note (Signed)
[facility] Patient Name: Jose [name] Procedure Date: 11/26/2022 10:13 AM MRN: [id number] Endoscopist: Sherilyn Cooter L. Myrtie Neither , MD, [id number] Age: 52 Referring MD:  Date of Birth: January 24, 1971 Gender: Male Account #: [id number] Procedure:                Upper GI endoscopy Indications:              Esophageal dysphagia, For therapy of esophageal                            stricture Medicines:                Monitored Anesthesia Care Procedure:                Pre-Anesthesia Assessment:                           - Prior to the procedure, a History and Physical                            was performed, and patient medications and                            allergies were reviewed. The patient's tolerance of                            previous anesthesia was also reviewed. The risks                            and benefits of the procedure and the sedation                            options and risks were discussed with the patient.                            All questions were answered, and informed consent                            was obtained. Prior Anticoagulants: The patient has                            taken no anticoagulant or antiplatelet agents. ASA                            Grade Assessment: II - A patient with mild systemic                            disease. After reviewing the risks and benefits,                            the patient was deemed in satisfactory condition to                            undergo the procedure.  After obtaining informed consent, the endoscope was                            passed under direct vision. Throughout the                            procedure, the patient's blood pressure, pulse, and                            oxygen saturations were monitored continuously. The                            GIF [id number] #[id number] was introduced through the                            mouth, and advanced to the second part of duodenum.                             The upper GI endoscopy was accomplished without                            difficulty. The patient tolerated the procedure                            well. Scope In: Scope Out: Findings:                 There is no endoscopic evidence of esophagitis in                            the entire esophagus.                           One benign-appearing, intrinsic moderate stenosis                            was found at the gastroesophageal junction. This                            stenosis measured 1.2 cm (inner diameter) x less                            than one cm (in length). The stenosis was                            traversed. A TTS dilator was passed through the                            scope. Dilation with a 16-17-18 mm balloon dilator                            was performed to 16 mm. The dilation site was  examined and showed moderate mucosal disruption and                            moderate improvement in luminal narrowing.                           The stomach was normal.                           The cardia and gastric fundus were normal on                            retroflexion.                           The examined duodenum was normal. Complications:            No immediate complications. Estimated Blood Loss:     Estimated blood loss was minimal. Impression:               - Benign-appearing esophageal stenosis. Dilated.                           - Normal stomach.                           - Normal examined duodenum.                           - No specimens collected. Recommendation:           - Patient has a contact number available for                            emergencies. The signs and symptoms of potential                            delayed complications were discussed with the                            patient. Return to normal activities tomorrow.                            Written discharge instructions were  provided to the                            patient.                           - Resume previous diet.                           - Continue present medications.                           - Continue once daily PPI                           Clinic visit 2-3  months Larry Alcock L. Myrtie Neither, MD 11/26/2022 10:36:35 AM This report has been signed electronically.

## 2022-11-26 NOTE — Progress Notes (Signed)
A and O x3. Report to RN. Tolerated MAC anesthesia well.Teeth unchanged after procedure. 

## 2022-11-26 NOTE — Progress Notes (Signed)
History and Physical:  This patient presents for endoscopic testing for: Encounter Diagnosis  Name Primary?   Esophageal dysphagia Yes  Esophageal stricture  Clinical details in 10/02/22 office progress note. Symptomatically improved after March 2024 EGD with balloon dilation Currently on PPI, here for endoscopic re-evaluation of esophagitis and stricture with possible further balloon dilation as needed  Patient is otherwise without complaints or active issues today.   Past Medical History: Past Medical History:  Diagnosis Date   Dysphagia    EGD : occult stricture , Bx slt increaed eosinophiles   Morton's neuroma    RAD (reactive airway disease)    ??   Recurrent sinusitis      Past Surgical History: Past Surgical History:  Procedure Laterality Date   ESOPHAGOGASTRODUODENOSCOPY (EGD) WITH ESOPHAGEAL DILATION     KNEE ARTHROSCOPY Right 03/2018    Allergies: Allergies  Allergen Reactions   Nexium [Esomeprazole Magnesium] Nausea Only    Outpatient Meds: Current Outpatient Medications  Medication Sig Dispense Refill   omeprazole (PRILOSEC) 40 MG capsule TAKE 1 CAPSULE(40 MG) BY MOUTH TWICE DAILY 30 TO 60 MINUTES BEFORE BREAKFAST AND SUPPER 60 capsule 0   polyethylene glycol (MIRALAX) 17 g packet Take 17 g by mouth daily as needed.     Current Facility-Administered Medications  Medication Dose Route Frequency Provider Last Rate Last Admin   0.9 %  sodium chloride infusion  500 mL Intravenous Once Sherrilyn Rist, MD          ___________________________________________________________________ Objective   Exam:  BP 135/84   Pulse 85   Temp 97.8 F (36.6 C)   Ht 5\' 10"  (1.778 m)   Wt 237 lb (107.5 kg)   SpO2 97%   BMI 34.01 kg/m   CV: regular , S1/S2 Resp: clear to auscultation bilaterally, normal RR and effort noted GI: soft, no tenderness, with active bowel sounds.   Assessment: Encounter Diagnosis  Name Primary?   Esophageal dysphagia Yes      Plan:  EGD with possible dilation  The benefits and risks of the planned procedure were described in detail with the patient or (when appropriate) their health care proxy.  Risks were outlined as including, but not limited to, bleeding, infection, perforation, adverse medication reaction leading to cardiac or pulmonary decompensation, pancreatitis (if ERCP).  The limitation of incomplete mucosal visualization was also discussed.  No guarantees or warranties were given.  The patient is appropriate for an endoscopic procedure in the ambulatory setting.   - Amada Jupiter, MD

## 2022-11-27 ENCOUNTER — Telehealth: Payer: Self-pay

## 2022-11-27 NOTE — Telephone Encounter (Signed)
Attempted f/u call. No answer, left VM. 

## 2022-12-04 ENCOUNTER — Other Ambulatory Visit: Payer: Self-pay

## 2022-12-04 MED ORDER — OMEPRAZOLE 40 MG PO CPDR: DELAYED_RELEASE_CAPSULE | ORAL | 1 refills | Status: DC

## 2023-02-26 ENCOUNTER — Ambulatory Visit: Payer: 59 | Admitting: Gastroenterology

## 2023-03-03 ENCOUNTER — Encounter: Payer: Self-pay | Admitting: Nurse Practitioner

## 2023-03-03 ENCOUNTER — Ambulatory Visit: Payer: 59 | Admitting: Nurse Practitioner

## 2023-03-03 VITALS — BP 130/70 | HR 88 | Ht 70.0 in | Wt 239.5 lb

## 2023-03-03 DIAGNOSIS — Z860100 Personal history of colon polyps, unspecified: Secondary | ICD-10-CM | POA: Diagnosis not present

## 2023-03-03 DIAGNOSIS — K219 Gastro-esophageal reflux disease without esophagitis: Secondary | ICD-10-CM

## 2023-03-03 MED ORDER — OMEPRAZOLE 40 MG PO CPDR: 40.0000 mg | DELAYED_RELEASE_CAPSULE | Freq: Every day | ORAL | 1 refills | Status: DC

## 2023-03-03 NOTE — Patient Instructions (Signed)
We have sent the following medications to your pharmacy for you to pick up at your convenience: Omeprazole  Follow up in 1 year or as needed.  Due to recent changes in healthcare laws, you may see the results of your imaging and laboratory studies on MyChart before your provider has had a chance to review them.  We understand that in some cases there may be results that are confusing or concerning to you. Not all laboratory results come back in the same time frame and the provider may be waiting for multiple results in order to interpret others.  Please give [place] 48 hours in order for your provider to thoroughly review all the results before contacting the office for clarification of your results.   Thank you for trusting me with your gastrointestinal care!   Alcide Evener, CRNP

## 2023-03-03 NOTE — Progress Notes (Signed)
DD, pls enter one year visit recall thx

## 2023-03-03 NOTE — Progress Notes (Signed)
03/03/2023 Jose [name] [id number] May 11, 1971   Chief Complaint: Follow up after EGD and colonoscopy   History of Present Illness: Jose [name] d. Jose [name] is a 52 year old male with a past medical history of an esophageal stricture, GERD, diverticulosis and colon polyps. He is known by Dr. Myrtie [name]. He has a history of GERD with esophageal stricture and recurrent dysphagia. He underwent an EGD 07/31/2022 which showed tight circumferentially fold mucosa in the esophagus, a benign-appearing esophageal stenosis which was dilated. Biopsies of the mid distal esophagus showed reflux esophagitis without evidence of Barrett's esophagus. A follow up EGD 11/26/2022 showed a benign esophageal stenosis which was dilated otherwise was unremarkable.  He presents today for further GI follow-up.  He stated since his EGD 7/2 he feels well.  No dysphagia or heartburn.  He stated his chronic throat clearing also abated.  He remains on Omeprazole 40 mg daily.  No upper or lower abdominal pain.  He is passing normal formed brown bowel movement daily.  Colonoscopy 07/31/2022 identified four tubular adenomatous polyps removed from the colon.  Rare NSAID use.     Latest Ref Rng & Units 08/29/2022   11:11 AM 08/29/2021    8:19 AM 08/02/2020    8:01 AM  CBC  WBC 4.0 - 10.5 K/uL 6.0  6.2  6.7   Hemoglobin 13.0 - 17.0 g/dL 56.2  13.0  86.5   Hematocrit 39.0 - 52.0 % 43.7  42.9  43.1   Platelets 150.0 - 400.0 K/uL 345.0  316.0  314.0        Latest Ref Rng & Units 08/29/2022   11:11 AM 08/29/2021    8:19 AM 08/02/2020    8:01 AM  CMP  Glucose 70 - 99 mg/dL 72  90  87   BUN 6 - 23 mg/dL 12  10  9    Creatinine 0.40 - 1.50 mg/dL 7.84  6.96  2.95   Sodium 135 - 145 mEq/L 138  139  138   Potassium 3.5 - 5.1 mEq/L 4.3  4.1  4.1   Chloride 96 - 112 mEq/L 104  103  104   CO2 19 - 32 mEq/L 28  27  24    Calcium 8.4 - 10.5 mg/dL 9.0  9.2  9.1   Total Protein 6.0 - 8.3 g/dL 6.9  7.1  7.3   Total Bilirubin 0.2 - 1.2 mg/dL 0.5  0.6  0.6    Alkaline Phos 39 - 117 U/L 87  88  82   AST 0 - 37 U/L 26  22  19    ALT 0 - 53 U/L 33  30  27     MOST RECENT GI PROCEDURES:  EGD 7/02/20204: - Benign-appearing esophageal stenosis. Dilated.  - Normal stomach.  - Normal examined duodenum.  - No specimens collected.   EGD 07/31/2022: - Normal larynx.  - Longitudinally marked, tight circumferentially folded mucosa in the esophagus.  - Benign-appearing esophageal stenosis. Dilated.  - Normal stomach. - Normal examined duodenum.  - Several biopsies were obtained in the middle third of the esophagus and in the lower third of the esophagus.  Colonoscopy 07/31/2022: - Diverticulosis in the right colon.  - Three 3 to 5 mm polyps in the transverse colon and in the ascending colon, removed with a cold snare. Resected and retrieved.  - One 6 mm polyp in the descending colon, removed with a hot snare. Resected and retrieved.  - The examination was otherwise normal on direct and  retroflexion views. - 3 year recall colonoscopy   1. Surgical [P], distal esophagus REFLUX ESOPHAGITIS (6 EOS/HPF) NEGATIVE FOR GLANDULAR EPITHELIUM, DYSPLASIA AND CARCINOMA 2. Surgical [P], mid esophagus REFLUX ESOPHAGITIS (6 EOS/HPF) NEGATIVE FOR GLANDULAR EPITHELIUM, DYSPLASIA AND CARCINOMA 3. Surgical [P], colon, transverse and ascending, polyp (3) TUBULAR ADENOMAS NEGATIVE FOR HIGH-GRADE DYSPLASIA AND CARCINOMA 4. Surgical [P], colon, descending, polyp (1) TUBULAR ADENOMA NEGATIVE FOR HIGH-GRADE DYSPLASIA AND CARCINOMA  Current Outpatient Medications on File Prior to Visit  Medication Sig Dispense Refill   polyethylene glycol (MIRALAX) 17 g packet Take 17 g by mouth daily as needed.     Current Facility-Administered Medications on File Prior to Visit  Medication Dose Route Frequency Provider Last Rate Last Admin   0.9 %  sodium chloride infusion  500 mL Intravenous Once Jose [name], Jose Blower, MD       Allergies  Allergen Reactions   Nexium [Esomeprazole  Magnesium] Nausea Only   Current Medications, Allergies, Past Medical History, Past Surgical History, Family History and Social History were reviewed in [employer] record.  Review of Systems:   Constitutional: Negative for fever, sweats, chills or weight loss.  Respiratory: Negative for shortness of breath.   Cardiovascular: Negative for chest pain, palpitations and leg swelling.  Gastrointestinal: See HPI.  Musculoskeletal: Negative for back pain or muscle aches.  Neurological: Negative for dizziness, headaches or paresthesias.   Physical Exam: BP 130/70 (BP Location: Left Arm, Patient Position: Sitting, Cuff Size: Normal)   Pulse 88   Ht 5\' 10"  (1.778 m)   Wt 239 lb 8 oz (108.6 kg)   BMI 34.36 kg/m   General: 52 year old male in no acute distress. Head: Normocephalic and atraumatic. Eyes: No scleral icterus. Conjunctiva pink . Ears: Normal auditory acuity. Mouth: Dentition intact. No ulcers or lesions.  Lungs: Clear throughout to auscultation. Heart: Regular rate and rhythm, no murmur. Abdomen: Soft, nontender and nondistended. No masses or hepatomegaly. Normal bowel sounds x 4 quadrants.  Rectal: Deferred.  Musculoskeletal: Symmetrical with no gross deformities. Extremities: No edema. Neurological: Alert oriented x 4. No focal deficits.  Psychological: Alert and cooperative. Normal mood and affect  Assessment and Recommendations:  52 year old male with a history of GERD, benign esophageal stricture and dysphagia. EGD 07/31/2022 showed tight circumferentially fold mucosa in the esophagus, a benign-appearing esophageal stenosis which was dilated. Biopsies of the mid and distal esophagus showed reflux esophagitis without evidence of Barrett's esophagus. A follow up EGD 11/26/2022 showed a benign esophageal stenosis which was dilated otherwise was unremarkable.  No active GERD symptoms or dysphagia.  Throat clearing abated.  He remains on Omeprazole 40 mg  daily. -Continue 40 mg daily -GERD diet -Patient to contact office if GERD or dysphagia symptoms recur -Avoid NSAIDs  History of colon polyps. Colonoscopy 07/31/2022 identified for tubular adenomatous polyps removed from the colon.  -Next colonoscopy due 07/2025

## 2023-03-03 NOTE — Progress Notes (Signed)
____________________________________________________________  Attending physician addendum:  Thank you for sending this case to me. I have reviewed the entire note and agree with the plan.  In addition to medicines, attention to antireflux diet and lifestyle measures is certainly warranted as well.  We should see him in a year (or sooner if needed) since he will remain on prescription PPI at this point.  Amada Jupiter, MD  ____________________________________________________________

## 2023-09-01 ENCOUNTER — Encounter: Payer: Self-pay | Admitting: Internal Medicine

## 2023-09-01 ENCOUNTER — Ambulatory Visit (INDEPENDENT_AMBULATORY_CARE_PROVIDER_SITE_OTHER): Payer: 59 | Admitting: Internal Medicine

## 2023-09-01 VITALS — BP 122/80 | HR 77 | Temp 98.3°F | Resp 16 | Ht 70.0 in | Wt 240.2 lb

## 2023-09-01 DIAGNOSIS — Z0000 Encounter for general adult medical examination without abnormal findings: Secondary | ICD-10-CM | POA: Diagnosis not present

## 2023-09-01 LAB — COMPREHENSIVE METABOLIC PANEL WITH GFR
ALT: 24 U/L (ref 0–53)
AST: 17 U/L (ref 0–37)
Albumin: 4.7 g/dL (ref 3.5–5.2)
Alkaline Phosphatase: 100 U/L (ref 39–117)
BUN: 13 mg/dL (ref 6–23)
CO2: 28 meq/L (ref 19–32)
Calcium: 9.4 mg/dL (ref 8.4–10.5)
Chloride: 103 meq/L (ref 96–112)
Creatinine, Ser: 0.82 mg/dL (ref 0.40–1.50)
GFR: 102.32 mL/min (ref >60.00)
Glucose, Bld: 95 mg/dL (ref 70–99)
Potassium: 5 meq/L (ref 3.5–5.1)
Sodium: 140 meq/L (ref 135–145)
Total Bilirubin: 0.4 mg/dL (ref 0.2–1.2)
Total Protein: 7.2 g/dL (ref 6.0–8.3)

## 2023-09-01 LAB — CBC WITH DIFFERENTIAL/PLATELET
Basophils Absolute: 0.1 10*3/uL (ref 0.0–0.1)
Basophils Relative: 1 % (ref 0.0–3.0)
Eosinophils Absolute: 0.1 10*3/uL (ref 0.0–0.7)
Eosinophils Relative: 1.9 % (ref 0.0–5.0)
HCT: 44.4 % (ref 39.0–52.0)
Hemoglobin: 15.2 g/dL (ref 13.0–17.0)
Lymphocytes Relative: 22.8 % (ref 12.0–46.0)
Lymphs Abs: 1.6 10*3/uL (ref 0.7–4.0)
MCHC: 34.1 g/dL (ref 30.0–36.0)
MCV: 84.5 fl (ref 78.0–100.0)
Monocytes Absolute: 0.7 10*3/uL (ref 0.1–1.0)
Monocytes Relative: 10.1 % (ref 3.0–12.0)
Neutro Abs: 4.6 10*3/uL (ref 1.4–7.7)
Neutrophils Relative %: 64.2 % (ref 43.0–77.0)
Platelets: 357 10*3/uL (ref 150.0–400.0)
RBC: 5.25 Mil/uL (ref 4.22–5.81)
RDW: 14.5 % (ref 11.5–15.5)
WBC: 7.1 10*3/uL (ref 4.0–10.5)

## 2023-09-01 LAB — PSA: PSA: 0.31 ng/mL (ref 0.10–4.00)

## 2023-09-01 LAB — LIPID PANEL
Cholesterol: 146 mg/dL (ref 0–200)
HDL: 35.5 mg/dL — ABNORMAL LOW (ref >39.00)
LDL Cholesterol: 80 mg/dL (ref 0–99)
NonHDL: 110.92
Total CHOL/HDL Ratio: 4
Triglycerides: 156 mg/dL — ABNORMAL HIGH (ref 0.0–149.0)
VLDL: 31.2 mg/dL (ref 0.0–40.0)

## 2023-09-01 LAB — HEMOGLOBIN A1C: Hgb A1c MFr Bld: 5.7 % (ref 4.6–6.5)

## 2023-09-01 MED ORDER — SILDENAFIL CITRATE 20 MG PO TABS: 60.0000 mg | ORAL_TABLET | Freq: Every evening | ORAL | 3 refills | Status: DC | PRN

## 2023-09-01 NOTE — Patient Instructions (Addendum)
 Vaccines I recommend: Covid booster A flu shot every fall.  Watch your diet carefully, calorie counting?  Weight watchers?      GO TO THE LAB : Get the blood work     Please go to the front desk: Arrange for another physical exam in 1 year.

## 2023-09-01 NOTE — Progress Notes (Signed)
 Subjective:    Patient ID: Jose [name], male    DOB: 08-12-1970, 53 y.o.   MRN: [id number]  DOS:  09/01/2023 Type of visit - description: CPX  Here for CPX. Doing well. Having a hard time losing weight.  Review of Systems  Other than above, a 14 point review of systems is negative      Past Medical History:  Diagnosis Date   Dysphagia    EGD : occult stricture , Bx slt increaed eosinophiles   Morton's neuroma    RAD (reactive airway disease)    ??   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   2 children son and daughter   Lives with wife and two children in a 2 story home.         Social Drivers of Corporate investment banker Strain: Not on file  Food Insecurity: Not on file  Transportation Needs: Not on file  Physical Activity: Not on file  Stress: Not on file  Social Connections: Not on file  Intimate Partner Violence: Not on file     Current Outpatient Medications  Medication Instructions   omeprazole (PRILOSEC) 40 mg, Oral, Daily, Take 30 minutes before breakfast.   polyethylene glycol (MIRALAX) 17 g, Daily PRN   sildenafil (REVATIO) 60-80 mg, Oral, At bedtime PRN        Objective:   Physical Exam BP 122/80   Pulse 77   Temp 98.3 F (36.8 C) (Oral)   Resp 16   Ht 5\' 10"  (1.778 m)   Wt 240 lb 4 oz (109 kg)   SpO2 96%   BMI 34.47 kg/m  General: Well developed, NAD, BMI noted Neck: No  thyromegaly  HEENT:  Normocephalic . Face symmetric, atraumatic Lungs:  CTA B Normal respiratory effort, no intercostal retractions, no accessory muscle use. Heart: RRR,  no murmur.  Abdomen:  Not distended, soft, non-tender. No rebound or rigidity.   Lower extremities: no pretibial edema bilaterally  Skin: Exposed areas without rash. Not pale. Not jaundice Neurologic:  alert & oriented X3.  Speech normal, gait appropriate for age and unassisted Strength symmetric and appropriate for age.  Psych: Cognition and judgment appear intact.  Cooperative with normal attention span and concentration.  Behavior appropriate. No anxious or depressed appearing.     Assessment    Assessment Recurrent sinusitis ? Reactive airway disease GI: -Dysphagia: EGD x 2  >>> occult stricture, s/p stretching , BX increased eosinophils.  EGD again 07/31/2022 (had a dilatation).  Follow-up EGD 11/26/2022 -Chronic constipation,  Cscope 02-2017, +  polyps -Dyspepsia: abd [place] 02-2017: Contracted bladder without stones, saw GI, no surgical referral, CT abdomen 02/2017: Nonacute Paresthesias: onset 2014, chronic, feet > rest of the body, saw  neurology 2015, 2016 (Dr Allena Katz). EMG 03-29-14 neg, MRI of the back in 09/2013 normal.  Intolerant to Neurontin, B6 was a slightly low, sxs decreased with OTC B12 supplements; slt better w/ Morton's neuroma local injection (2016)    PLAN: Here for CPX -Tdap 2024 - shingrex vacc d/w pt  - - Req a flu shot q fall, Covid vax  - CCS: Cscope (done for constipation)02-2017, 1 polyp.  C-scope again 07/31/2022, next per GI -Prostate ca screening: No SX, no FH.  Check PSA - Labs: CMP FLP CBC A1c PSA - Lifestyle discussed, he is trying to be more active, not  able to lose weight, already started to make some changes, we discussed calorie counting, weight watchers, stop regular sodas. Other issues: Dysphagia: Not an  issue at this time, on PPIs  ED: Request treatment, Viagra?.  I do not see no contraindication, prescription for sildenafil provided, how to use and what to expect, side effects discussed with patient Social: to retired at the end of the year RTC 1 year

## 2023-09-01 NOTE — Assessment & Plan Note (Signed)
 Here for CPX -Tdap 2024 - shingrex vacc d/w pt  - - Req a flu shot q fall, Covid vax  - CCS: Cscope (done for constipation)02-2017, 1 polyp.  C-scope again 07/31/2022, next per GI -Prostate ca screening: No SX, no FH.  Check PSA - Labs: CMP FLP CBC A1c PSA - Lifestyle discussed, he is trying to be more active, not able to lose weight, already started to make some changes, we discussed calorie counting, weight watchers, stop regular sodas.

## 2023-09-01 NOTE — Assessment & Plan Note (Signed)
 Here for CPX  Other issues: Dysphagia: Not an  issue at this time, on PPIs  ED: Request treatment, Viagra?.  I do not see no contraindication, prescription for sildenafil provided, how to use and what to expect, side effects discussed with patient Social: to retired at the end of the year RTC 1 year

## 2023-09-03 ENCOUNTER — Encounter: Payer: Self-pay | Admitting: Internal Medicine

## 2023-10-06 ENCOUNTER — Encounter: Payer: Self-pay | Admitting: Internal Medicine

## 2023-10-06 ENCOUNTER — Ambulatory Visit: Admitting: Internal Medicine

## 2023-10-06 VITALS — BP 126/80 | HR 62 | Temp 98.2°F | Resp 16 | Ht 70.0 in | Wt 242.4 lb

## 2023-10-06 DIAGNOSIS — N50811 Right testicular pain: Secondary | ICD-10-CM

## 2023-10-06 NOTE — Progress Notes (Unsigned)
   Subjective:    Patient ID: RIGLEY FALGOUT, male    DOB: 07-18-1970, 53 y.o.   MRN: [id number]  DOS:  10/06/2023 Type of visit - description: Acute  Symptoms started about 5 weeks ago with initially at least moderate pain at the back of the right testicle. He also noted some swelling there. He thinks related to playing pickle ball.  Denies any injury. Pain gradually is getting better, 2 days ago started to take ibuprofen and the pain is almost completely gone.  No fever or chills. No testicular injury No penile discharge No rash. No dysuria or gross hematuria    Review of Systems See above   Past Medical History:  Diagnosis Date   Dysphagia    EGD : occult stricture , Bx slt increaed eosinophiles   Morton's neuroma    RAD (reactive airway disease)    ??   Recurrent sinusitis     Past Surgical History:  Procedure Laterality Date   ESOPHAGOGASTRODUODENOSCOPY (EGD) WITH ESOPHAGEAL DILATION     KNEE ARTHROSCOPY Right 03/2018    Current Outpatient Medications  Medication Instructions   omeprazole  (PRILOSEC) 40 mg, Oral, Daily, Take 30 minutes before breakfast.   polyethylene glycol (MIRALAX) 17 g, Daily PRN   sildenafil  (REVATIO ) 60-80 mg, Oral, At bedtime PRN       Objective:   Physical Exam BP 126/80   Pulse 62   Temp 98.2 F (36.8 C) (Oral)   Resp 16   Ht 5\' 10"  (1.778 m)   Wt 242 lb 6 oz (109.9 kg)   SpO2 99%   BMI 34.78 kg/m  General:   Well developed, NAD, BMI noted. HEENT:  Normocephalic . Face symmetric, atraumatic Abdomen: Soft, nontender. GU: Normal's with no rash or discharge Scrotal contents normal No inguinal hernias.  Skin: Not pale. Not jaundice Neurologic:  alert & oriented X3.  Speech normal, gait appropriate for age and unassisted Psych--  Cognition and judgment appear intact.  Cooperative with normal attention span and concentration.  Behavior appropriate. No anxious or depressed appearing.      Assessment      Assessment Recurrent sinusitis ? Reactive airway disease GI: -Dysphagia: EGD x 2  >>> occult stricture, s/p stretching , BX increased eosinophils.  EGD again 07/31/2022 (had a dilatation).  Follow-up EGD 11/26/2022 -Chronic constipation,  Cscope 02-2017, + polyps -Dyspepsia: abd [place]  02-2017: Contracted bladder without stones, saw GI, no surgical referral, CT abdomen 02/2017: Nonacute Paresthesias: onset 2014, chronic, feet > rest of the body, saw  neurology 2015, 2016 (Dr Lydia Sams). EMG 03-29-14 neg, MRI of the back in 09/2013 normal.  Intolerant to Neurontin , B6 was a slightly low, sxs decreased with OTC B12 supplements; slt better w/ Morton's neuroma local injection (2016)    PLAN: R testicle pain: As described above, almost resolved now, exam is benign. Resolving epididymitis?Aaron Aas  No evidence of hernia or active orchitis. Plan: Check ultrasound to ensure that everything is okay, if symptoms resurface he will let me know.

## 2023-10-07 ENCOUNTER — Ambulatory Visit: Payer: Self-pay | Admitting: Internal Medicine

## 2023-10-07 ENCOUNTER — Ambulatory Visit
Admission: RE | Admit: 2023-10-07 | Discharge: 2023-10-07 | Disposition: A | Discharge status: Home / Self Care | Source: Ambulatory Visit | Attending: Internal Medicine | Admitting: Internal Medicine

## 2023-10-07 NOTE — Assessment & Plan Note (Signed)
 R testicle pain: As described above, almost resolved now, exam is benign. Resolving epididymitis?Aaron Aas  No evidence of hernia or active orchitis. Plan: Check ultrasound to ensure that everything is okay, if symptoms resurface he will let me know.

## 2023-10-30 ENCOUNTER — Other Ambulatory Visit: Payer: Self-pay | Admitting: Nurse Practitioner

## 2023-12-09 ENCOUNTER — Other Ambulatory Visit: Payer: Self-pay

## 2023-12-09 MED ORDER — OMEPRAZOLE 40 MG PO CPDR: 40.0000 mg | DELAYED_RELEASE_CAPSULE | Freq: Every day | ORAL | 0 refills | Status: DC

## 2024-05-02 NOTE — Progress Notes (Signed)
   Acute Office Visit  Subjective:  Patient ID: Jose [name] [name], male    DOB: Sep 02, 1970  Age: 53 y.o. MRN: [id number]  CC: No chief complaint on file.     HPI Jose [name] [name] is here to discuss possible work accommodations.         Past Medical History:  Diagnosis Date   Dysphagia    EGD : occult stricture , Bx slt increaed eosinophiles   Morton's neuroma    RAD (reactive airway disease)    ??   Recurrent sinusitis     Past Surgical History:  Procedure Laterality Date   ESOPHAGOGASTRODUODENOSCOPY (EGD) WITH ESOPHAGEAL DILATION     KNEE ARTHROSCOPY Right 03/2018    Family History  Problem Relation Age of Onset   Arthritis Mother        Living   Healthy Mother        Living   Thyroid  disease Sister    Asthma Son        childhood   Colon cancer Neg Hx    Prostate cancer Neg Hx    CAD Neg Hx    Diabetes Neg Hx     Social History   Socioeconomic History   Marital status: Married    Spouse name: Not on file   Number of children: 2   Years of education: Not on file   Highest education level: Not on file  Occupational History   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   2 children son and daughter   Lives with wife and two children in a 2 story home.         Social Drivers of Corporate Investment Banker Strain: Not on file  Food Insecurity: Not on file  Transportation Needs: Not on file  Physical Activity: Not on file  Stress: Not on file  Social Connections: Not on file  Intimate Partner Violence: Not on file    ROS All ROS negative except what is listed  in the HPI.   Objective:   Today's Vitals: There were no vitals taken for this visit.  Physical Exam  Assessment & Plan:   Problem List Items Addressed This Visit   None     Follow-up: No follow-ups on file.   Waddell FURY Almarie, DNP, FNP-C  I,Emily Lagle,acting as a neurosurgeon for Waddell KATHEE Almarie, NP.,have documented all relevant documentation on the behalf of Waddell KATHEE Almarie, NP.   I, Waddell KATHEE Almarie, NP, have reviewed all documentation for this visit. The documentation on 05/03/2024 for the exam, diagnosis, procedures, and orders are all accurate and complete.

## 2024-05-03 ENCOUNTER — Ambulatory Visit: Admitting: Family Medicine

## 2024-05-03 ENCOUNTER — Other Ambulatory Visit: Payer: Self-pay

## 2024-05-03 ENCOUNTER — Encounter: Payer: Self-pay | Admitting: Family Medicine

## 2024-05-03 VITALS — BP 115/76 | HR 100 | Ht 70.0 in | Wt 242.0 lb

## 2024-05-03 DIAGNOSIS — G6289 Other specified polyneuropathies: Secondary | ICD-10-CM

## 2024-05-03 MED ORDER — OMEPRAZOLE 40 MG PO CPDR: 40.0000 mg | DELAYED_RELEASE_CAPSULE | Freq: Every day | ORAL | 0 refills | Status: AC

## 2024-09-01 ENCOUNTER — Encounter: Admitting: Internal Medicine
